# Patient Record
Sex: Male | Born: 1950 | Race: White | Hispanic: No | Smoking: Current every day smoker
Health system: Southern US, Community
[De-identification: ages and names within clinical notes are randomized; demographics above are authoritative.]

## PROBLEM LIST (undated history)

## (undated) DIAGNOSIS — H53002 Unspecified amblyopia, left eye: Secondary | ICD-10-CM

## (undated) DIAGNOSIS — I10 Essential (primary) hypertension: Secondary | ICD-10-CM

## (undated) DIAGNOSIS — J449 Chronic obstructive pulmonary disease, unspecified: Secondary | ICD-10-CM

## (undated) HISTORY — DX: Unspecified amblyopia, left eye: H53.002

## (undated) HISTORY — DX: Chronic obstructive pulmonary disease, unspecified: J44.9

## (undated) HISTORY — PX: EYE SURGERY: SHX253

## (undated) HISTORY — DX: Essential (primary) hypertension: I10

---

## 2014-01-07 ENCOUNTER — Ambulatory Visit (INDEPENDENT_AMBULATORY_CARE_PROVIDER_SITE_OTHER): Payer: Commercial Managed Care - HMO | Admitting: Physician Assistant

## 2014-01-07 ENCOUNTER — Encounter: Payer: Self-pay | Admitting: Physician Assistant

## 2014-01-07 VITALS — BP 138/72 | HR 73 | Ht 72.0 in | Wt 160.0 lb

## 2014-01-07 DIAGNOSIS — K2990 Gastroduodenitis, unspecified, without bleeding: Secondary | ICD-10-CM

## 2014-01-07 DIAGNOSIS — F3289 Other specified depressive episodes: Secondary | ICD-10-CM

## 2014-01-07 DIAGNOSIS — F32A Depression, unspecified: Secondary | ICD-10-CM | POA: Insufficient documentation

## 2014-01-07 DIAGNOSIS — F329 Major depressive disorder, single episode, unspecified: Secondary | ICD-10-CM | POA: Insufficient documentation

## 2014-01-07 DIAGNOSIS — K2970 Gastritis, unspecified, without bleeding: Secondary | ICD-10-CM

## 2014-01-07 DIAGNOSIS — F1010 Alcohol abuse, uncomplicated: Secondary | ICD-10-CM

## 2014-01-07 MED ORDER — PANTOPRAZOLE SODIUM 20 MG PO TBEC: 40.0000 mg | DELAYED_RELEASE_TABLET | Freq: Two times a day (BID) | ORAL | Status: DC

## 2014-01-07 MED ORDER — SUCRALFATE 1 G PO TABS: 1.0000 g | ORAL_TABLET | Freq: Four times a day (QID) | ORAL | Status: DC

## 2014-01-07 NOTE — Progress Notes (Signed)
Subjective:    Patient ID: Timothy Perkins, male    DOB: 1950/08/13, 63 y.o.   MRN: [id number]  HPI  Patient is a 63 year old male who presents to the clinic to establish care. Patient makes it very clear today that he only wants to get medication refills for his gastritis. He feels like his presentation to the emergency room on 11/16/2013 at [facility] was due to over consumption of liquor. He has since decreased and almost never drinks liquor. He continues to drink multiple beers every night. He was placed on Carafate and pantoprazole. CT was done and showed inflammation of stomach and duoduem. Labs were done and negative for any blood loss. Kidney and liver unremarkable.  He reports his abdominal pain has completely resolved. Patient denies any bright red blood or black tarry stools. He does admit to being very down and depressed since his wife of 73 years and has friends have all died with the same month. He denies any suicidal or homicidal thoughts.  .. Active Ambulatory Problems    Diagnosis Date Noted  . Alcohol abuse 01/07/2014  . Depression 01/07/2014   Resolved Ambulatory Problems    Diagnosis Date Noted  . No Resolved Ambulatory Problems   Past Medical History  Diagnosis Date  . Lazy eye of left side    .Marland Kitchen Family History  Problem Relation Age of Onset  . Cancer Father     Lung/smoker  . Kidney disease Sister    .Marland Kitchen History   Social History  . Marital Status: Widowed    Spouse Name: N/A    Number of Children: N/A  . Years of Education: N/A   Occupational History  . Not on file.   Social History Main Topics  . Smoking status: Current Every Day Smoker -- 1.00 packs/day for 50 years    Types: Cigarettes  . Smokeless tobacco: Never Used  . Alcohol Use: Yes  . Drug Use: No  . Sexual Activity: No   Other Topics Concern  . Not on file   Social History Narrative  . No narrative on file      Review of Systems  All other systems reviewed and are negative.       Objective:   Physical Exam  Constitutional: He is oriented to person, place, and time. He appears well-developed and well-nourished.  HENT:  Head: Normocephalic and atraumatic.  Cardiovascular: Normal rate, regular rhythm and normal heart sounds.   Pulmonary/Chest: Effort normal and breath sounds normal.  Abdominal: Soft. Bowel sounds are normal. He exhibits no distension and no mass. There is no tenderness. There is no guarding.  Neurological: He is alert and oriented to person, place, and time.  Skin: Skin is dry.  Psychiatric: He has a normal mood and affect. His behavior is normal.          Assessment & Plan:  Gastritis/duodenitis- refilled Carafate and protonix today. Discussed with patient that he should be able to start decreasing  the Carafate and just stay on protonix. Encouraged decrease in alcohol could help further stomach issues. Patient is aware that smoking is well is exacerbating his stomach symptoms. He is not willing to stop smoking. I encouraged patient to let me make a referral to [facility] for endoscopy. Pt declined. He did have CT scan at hospital which was negative for any masses.   Depression-is certainly fully patient is depressed and would benefit from medication. Patient declines medication today. He clearly is using alcohol to self medicate from  his sadness. He does have Ativan given to him from the emergency room that he is using as needed for sleep. Patient does admit that he is sad and is trying to find some new hobbies and new friends.  I discussed with patient importance of checking cholesterol, liver, kidney and getting up to date on immunizations. Patient declines tetanus shot, shingles vaccine, pneumonia vaccine as well as any labs. He states that he will not get them done.

## 2014-01-08 DIAGNOSIS — K2990 Gastroduodenitis, unspecified, without bleeding: Secondary | ICD-10-CM | POA: Insufficient documentation

## 2014-09-18 ENCOUNTER — Encounter: Payer: Self-pay | Admitting: Physician Assistant

## 2014-09-18 ENCOUNTER — Ambulatory Visit (INDEPENDENT_AMBULATORY_CARE_PROVIDER_SITE_OTHER): Payer: Commercial Managed Care - HMO | Admitting: Physician Assistant

## 2014-09-18 VITALS — BP 132/68 | HR 87 | Wt 165.0 lb

## 2014-09-18 DIAGNOSIS — F419 Anxiety disorder, unspecified: Secondary | ICD-10-CM | POA: Insufficient documentation

## 2014-09-18 DIAGNOSIS — L259 Unspecified contact dermatitis, unspecified cause: Secondary | ICD-10-CM | POA: Diagnosis not present

## 2014-09-18 DIAGNOSIS — F411 Generalized anxiety disorder: Secondary | ICD-10-CM | POA: Insufficient documentation

## 2014-09-18 MED ORDER — METHYLPREDNISOLONE SODIUM SUCC 125 MG IJ SOLR
125.0000 mg | Freq: Once | INTRAMUSCULAR | Status: AC
  Administered 2014-09-18: 125 mg via INTRAVENOUS

## 2014-09-18 MED ORDER — TRIAMCINOLONE ACETONIDE 0.1 % EX CREA: 1.0000 "application " | TOPICAL_CREAM | Freq: Two times a day (BID) | CUTANEOUS | Status: DC

## 2014-09-18 MED ORDER — LORAZEPAM 1 MG PO TABS: 1.0000 mg | ORAL_TABLET | Freq: Three times a day (TID) | ORAL | Status: DC

## 2014-09-18 NOTE — Patient Instructions (Signed)

## 2014-09-20 ENCOUNTER — Encounter: Payer: Self-pay | Admitting: Physician Assistant

## 2014-09-20 NOTE — Progress Notes (Signed)
   Subjective:    Patient ID: Timothy Perkins, male    DOB: 1950-06-21, 64 y.o.   MRN: [id number]  HPI Pt presents to the clinic with an itchy rash on bilateral lower arms and right lower abdomen. Tried chlorox and calamine lotion. Itchy seems to improve some with treatment but rash is not going away. No fever, chills, cough, SOB. Rash is not painful. No new medications or detergents or lotions used recently. He is always out and bout in the woods and outside.   He also wants a refill on ativan. He uses every so often to sleep and calm down. Approximately uses 2-3 times a month. Admits to drinking alcohol but not with ativan.    Review of Systems  All other systems reviewed and are negative.      Objective:   Physical Exam  Constitutional: He appears well-developed and well-nourished.  HENT:  Head: Normocephalic and atraumatic.  Cardiovascular: Normal rate, regular rhythm and normal heart sounds.   Pulmonary/Chest: Effort normal and breath sounds normal. He has no wheezes.  Skin:     Psychiatric: He has a normal mood and affect. His behavior is normal.          Assessment & Plan:  Contact dermatitis- solumedrol 125mg  IM given today. Triamcinolone given to use over rash. HO given. Follow up if not improving. Reassured not shingles. Did encourage the shingles vaccine and pneumonia vaccine today. Pt declined and stated he would consider for fall.   Anxiety- refilled ativan for PRN usage. Encouraged pt to NOT drink with ativan.

## 2015-01-18 ENCOUNTER — Ambulatory Visit: Payer: Commercial Managed Care - HMO | Admitting: Family Medicine

## 2015-03-17 ENCOUNTER — Ambulatory Visit (INDEPENDENT_AMBULATORY_CARE_PROVIDER_SITE_OTHER): Payer: Commercial Managed Care - HMO | Admitting: Physician Assistant

## 2015-03-17 VITALS — BP 156/72 | HR 92 | Ht 72.0 in | Wt 159.0 lb

## 2015-03-17 DIAGNOSIS — K30 Functional dyspepsia: Secondary | ICD-10-CM

## 2015-03-17 DIAGNOSIS — R1031 Right lower quadrant pain: Secondary | ICD-10-CM

## 2015-03-17 DIAGNOSIS — R109 Unspecified abdominal pain: Secondary | ICD-10-CM

## 2015-03-17 DIAGNOSIS — F1010 Alcohol abuse, uncomplicated: Secondary | ICD-10-CM | POA: Diagnosis not present

## 2015-03-17 DIAGNOSIS — K2990 Gastroduodenitis, unspecified, without bleeding: Secondary | ICD-10-CM

## 2015-03-17 MED ORDER — GI COCKTAIL ~~LOC~~
30.0000 mL | Freq: Once | ORAL | Status: AC
  Administered 2015-03-17: 30 mL via ORAL

## 2015-03-17 MED ORDER — LORAZEPAM 1 MG PO TABS: 1.0000 mg | ORAL_TABLET | Freq: Three times a day (TID) | ORAL | Status: DC

## 2015-03-17 MED ORDER — OMEPRAZOLE 40 MG PO CPDR: 40.0000 mg | DELAYED_RELEASE_CAPSULE | Freq: Every day | ORAL | Status: DC

## 2015-03-17 NOTE — Progress Notes (Signed)
   Subjective:    Patient ID: Timothy Perkins, male    DOB: 08/21/1950, 64 y.o.   MRN: [id number]  HPI Patient is a 64 year old male who presents to the clinic with right lower quadrant pain that radiates into back. He reports this pain has been ongoing for the last 1-2 years. It usually comes and goes but it has recently been present more than not. He denies any nausea but the pain was so bad the other night he did vomit. He has not been taking any pain medicine. He does drink 4-5 beers every day. He denies any pain with urination. He is having daily to every other day stools without blood. He has not tried anything to make better. He states that this pain is similar to the pain he went to the emergency room in 2015 with he stated they gave him some omeprazole that helped with pain. CT scan was done and showed inflammation of the stomach. He was diagnosed with gastritis at that time. He has not been on omeprazole but taking Zantac over-the-counter at this time.   Review of Systems  All other systems reviewed and are negative.      Objective:   Physical Exam  Constitutional: He is oriented to person, place, and time. He appears well-developed and well-nourished.  HENT:  Head: Normocephalic and atraumatic.  Cardiovascular: Normal rate, regular rhythm and normal heart sounds.   Pulmonary/Chest: Effort normal and breath sounds normal.  No CVA tenderness.   Abdominal: Soft. Bowel sounds are normal. He exhibits no distension and no mass. There is tenderness. There is guarding. There is no rebound.  RLQ guarding and tenderness.   Neurological: He is alert and oriented to person, place, and time.  Skin: Skin is dry.  Psychiatric: He has a normal mood and affect. His behavior is normal.          Assessment & Plan:  Right lower quadrant pain- unclear etiology at this time. Patient reports the pain is very similar to when he was diagnosed with gastritis. I did give patient a GI cocktail in the  office today. He was also refilled his omeprazole. He has not been on omeprazole in a few months. Since there was some guarding on exam today I did want to do a CT scan. Since pain has been going on for over a year I'm not as inclined to think this is urgent. Certainly cannot rule out diverticulitis/colitis Patient politely declined CT. Will check CBC and CMP today. Discussed need for colonoscopy. Pt was very adamant he did not want a colonoscopy. Follow-up if pain is worsening or not improving.  Anxiety-I did refill his Ativan today in office for as needed use.  Alcohol abuse- will check liver enzymes today.

## 2015-12-26 ENCOUNTER — Other Ambulatory Visit: Payer: Self-pay | Admitting: Physician Assistant

## 2016-02-20 DIAGNOSIS — Z7982 Long term (current) use of aspirin: Secondary | ICD-10-CM | POA: Diagnosis not present

## 2016-02-20 DIAGNOSIS — R05 Cough: Secondary | ICD-10-CM | POA: Diagnosis not present

## 2016-02-20 DIAGNOSIS — R0602 Shortness of breath: Secondary | ICD-10-CM | POA: Diagnosis not present

## 2016-02-20 DIAGNOSIS — F17210 Nicotine dependence, cigarettes, uncomplicated: Secondary | ICD-10-CM | POA: Diagnosis not present

## 2016-02-20 DIAGNOSIS — Z79899 Other long term (current) drug therapy: Secondary | ICD-10-CM | POA: Diagnosis not present

## 2016-02-20 DIAGNOSIS — J441 Chronic obstructive pulmonary disease with (acute) exacerbation: Secondary | ICD-10-CM | POA: Diagnosis not present

## 2016-02-20 DIAGNOSIS — R062 Wheezing: Secondary | ICD-10-CM | POA: Diagnosis not present

## 2016-02-25 ENCOUNTER — Encounter: Payer: Self-pay | Admitting: Physician Assistant

## 2016-02-25 ENCOUNTER — Ambulatory Visit (INDEPENDENT_AMBULATORY_CARE_PROVIDER_SITE_OTHER): Payer: Commercial Managed Care - HMO | Admitting: Physician Assistant

## 2016-02-25 VITALS — BP 162/77 | HR 83 | Ht 72.0 in | Wt 154.0 lb

## 2016-02-25 DIAGNOSIS — K2990 Gastroduodenitis, unspecified, without bleeding: Secondary | ICD-10-CM

## 2016-02-25 DIAGNOSIS — Z760 Encounter for issue of repeat prescription: Secondary | ICD-10-CM | POA: Diagnosis not present

## 2016-02-25 DIAGNOSIS — F411 Generalized anxiety disorder: Secondary | ICD-10-CM | POA: Diagnosis not present

## 2016-02-25 DIAGNOSIS — J441 Chronic obstructive pulmonary disease with (acute) exacerbation: Secondary | ICD-10-CM

## 2016-02-25 DIAGNOSIS — F1010 Alcohol abuse, uncomplicated: Secondary | ICD-10-CM | POA: Diagnosis not present

## 2016-02-25 DIAGNOSIS — F32A Depression, unspecified: Secondary | ICD-10-CM

## 2016-02-25 DIAGNOSIS — F17200 Nicotine dependence, unspecified, uncomplicated: Secondary | ICD-10-CM | POA: Diagnosis not present

## 2016-02-25 DIAGNOSIS — F329 Major depressive disorder, single episode, unspecified: Secondary | ICD-10-CM | POA: Diagnosis not present

## 2016-02-25 MED ORDER — ALBUTEROL SULFATE HFA 108 (90 BASE) MCG/ACT IN AERS: 2.0000 | INHALATION_SPRAY | Freq: Four times a day (QID) | RESPIRATORY_TRACT | 1 refills | Status: AC | PRN

## 2016-02-25 MED ORDER — OMEPRAZOLE 40 MG PO CPDR: 40.0000 mg | DELAYED_RELEASE_CAPSULE | Freq: Every day | ORAL | 4 refills | Status: DC

## 2016-02-25 MED ORDER — LORAZEPAM 1 MG PO TABS: 1.0000 mg | ORAL_TABLET | Freq: Three times a day (TID) | ORAL | 1 refills | Status: DC

## 2016-02-25 NOTE — Progress Notes (Addendum)
Subjective:     Patient ID: Timothy Perkins, male   DOB: October 01, 1950, 65 y.o.   MRN: [id number]  HPI The patient is a 65 y.o. Caucasian male presenting today for a post-hospital follow-up on 02/20/2016. Patient presented to ER with complaints of shortness of breath and difficulty breathing. Patient states that he had a cold last week and believes that this may have precipitated his symptoms. He notes that he is currently doing well and that his symptoms have completely resolved. The patient states that yesterday he did have a productive cough and was able to expel mucus. Additionally, the patient notes that his allergies are worse than normal this time of year but that he has been taking his over-the-counter anti-histamines. Patient reports that he is still taking the medications he was given at the ER and that he need medication refills for his chronic disease management. Patient is a smoker and currently smokes 2/3 of a pack per day. Patient reports that he has a 10 lbs weight loss but believes this is normal for him during the summer months. Patient denies chest pain, shortness of breath, palpitations, dyspnea on exertion, fever, night sweats, nausea, or vomiting.  Review of Systems  Constitutional: Positive for unexpected weight change. Negative for activity change, appetite change, chills, diaphoresis, fatigue and fever.  HENT: Negative for congestion, ear discharge, ear pain, postnasal drip, rhinorrhea, sinus pressure, sneezing and sore throat.   Respiratory: Negative for cough, chest tightness, shortness of breath and wheezing.   Cardiovascular: Negative for chest pain and palpitations.  Neurological: Negative for dizziness, weakness, light-headedness, numbness and headaches.      Objective:   Physical Exam  Constitutional: He is oriented to person, place, and time. He appears well-developed and well-nourished. No distress.  HENT:  Head: Normocephalic and atraumatic.  Eyes: Conjunctivae and  EOM are normal. Pupils are equal, round, and reactive to light. Right eye exhibits no discharge. Left eye exhibits no discharge. No scleral icterus.  Neck: Normal range of motion. Neck supple. No JVD present. No tracheal deviation present. No thyromegaly present.  Cardiovascular: Normal rate, regular rhythm, normal heart sounds and intact distal pulses.  Exam reveals no gallop and no friction rub.   No murmur heard. Pulmonary/Chest: Effort normal. No stridor. No respiratory distress. He has wheezes. He has rales. He exhibits no tenderness.  On auscultation bilateral coarse breath sounds, rhonchi, and rales are heard diffusely with worsening at the base. Positive wheezing auscultated bilaterally with worsening heard over the right lung.  Abdominal: Soft. Bowel sounds are normal. He exhibits no distension and no mass. There is no tenderness. There is no rebound and no guarding.  Lymphadenopathy:    He has no cervical adenopathy.  Neurological: He is alert and oriented to person, place, and time. He has normal reflexes. He displays normal reflexes. No cranial nerve deficit. He exhibits normal muscle tone. Coordination normal.  Skin: Skin is warm and dry. No rash noted. He is not diaphoretic. No erythema. No pallor.  Psychiatric: He has a normal mood and affect. His behavior is normal. Judgment and thought content normal.      Assessment:    Timothy Perkins was seen today for hospitalization follow-up.  Diagnoses and all orders for this visit:  COPD exacerbation ([place])  Medication refill  Alcohol abuse  Anxiety state  Depression  Gastritis and duodenitis  Tobacco dependency  Other orders -     albuterol (PROVENTIL HFA;VENTOLIN HFA) 108 (90 Base) MCG/ACT inhaler; Inhale 2 puffs into the  lungs every 6 (six) hours as needed for wheezing or shortness of breath. -     LORazepam (ATIVAN) 1 MG tablet; Take 1 tablet (1 mg total) by mouth every 8 (eight) hours. -     omeprazole (PRILOSEC) 40 MG  capsule; Take 1 capsule (40 mg total) by mouth daily.    Plan:     1. Post-hospital follow-up for COPD - Patient was hospitalized on 02/20/2016 for acute COPD exacerbation. Patient had ECG and chest x-ray with no abnormalities noted. Patient started on albuterol inhaler, prednisone, and keflex. Patient reports resolution of symptoms but was instructed to complete all medications as previously prescribed. Patient was given refills for albuterol inhaler today. Patient requested to not see a specialist immediately or take additional medications. Suspect possible non-compliance of patient. Patient agreed to return-to-clinic in 1 week for spirometry. Will determine need for medical intervention and referral at that time. Patient instructed to seek immediate medical care if symptoms reoccur.   2. Anxiety/depression- Patient states that he does not believe he needs these medications but requested a refill. Patient does not report any anxiety or depressive symptoms at this time. Will continue to monitor.  3. Gastritis and duodenitis - Patient given refills for omeprazole 40mg  capsules. Patient to continue current medication regimen at this as he reports he feels well managed. Will continue to monitor.  4. Tobacco/alcohol abuse - Discussed with patient the importance of tobacco and alcohol cessation. Patient is aware of the adverse effects of tobacco and alcohol use but states that he does not want to stop. Will consider further counseling upon return-to-clinic.

## 2016-02-25 NOTE — Patient Instructions (Signed)
Chronic Obstructive Pulmonary Disease Chronic obstructive pulmonary disease (COPD) is a common lung condition in which airflow from the lungs is limited. COPD is a general term that can be used to describe many different lung problems that limit airflow, including both chronic bronchitis and emphysema. If you have COPD, your lung function will probably never return to normal, but there are measures you can take to improve lung function and make yourself feel better. CAUSES   Smoking (common).  Exposure to secondhand smoke.  Genetic problems.  Chronic inflammatory lung diseases or recurrent infections. SYMPTOMS  Shortness of breath, especially with physical activity.  Deep, persistent (chronic) cough with a large amount of thick mucus.  Wheezing.  Rapid breaths (tachypnea).  Gray or bluish discoloration (cyanosis) of the skin, especially in your fingers, toes, or lips.  Fatigue.  Weight loss.  Frequent infections or episodes when breathing symptoms become much worse (exacerbations).  Chest tightness. DIAGNOSIS Your health care provider will take a medical history and perform a physical examination to diagnose COPD. Additional tests for COPD may include:  Lung (pulmonary) function tests.  Chest X-ray.  CT scan.  Blood tests. TREATMENT  Treatment for COPD may include:  Inhaler and nebulizer medicines. These help manage the symptoms of COPD and make your breathing more comfortable.  Supplemental oxygen. Supplemental oxygen is only helpful if you have a low oxygen level in your blood.  Exercise and physical activity. These are beneficial for nearly all people with COPD.  Lung surgery or transplant.  Nutrition therapy to gain weight, if you are underweight.  Pulmonary rehabilitation. This may involve working with a team of health care providers and specialists, such as respiratory, occupational, and physical therapists. HOME CARE INSTRUCTIONS  Take all medicines  (inhaled or pills) as directed by your health care provider.  Avoid over-the-counter medicines or cough syrups that dry up your airway (such as antihistamines) and slow down the elimination of secretions unless instructed otherwise by your health care provider.  If you are a smoker, the most important thing that you can do is stop smoking. Continuing to smoke will cause further lung damage and breathing trouble. Ask your health care provider for help with quitting smoking. He or she can direct you to community resources or hospitals that provide support.  Avoid exposure to irritants such as smoke, chemicals, and fumes that aggravate your breathing.  Use oxygen therapy and pulmonary rehabilitation if directed by your health care provider. If you require home oxygen therapy, ask your health care provider whether you should purchase a pulse oximeter to measure your oxygen level at home.  Avoid contact with individuals who have a contagious illness.  Avoid extreme temperature and humidity changes.  Eat healthy foods. Eating smaller, more frequent meals and resting before meals may help you maintain your strength.  Stay active, but balance activity with periods of rest. Exercise and physical activity will help you maintain your ability to do things you want to do.  Preventing infection and hospitalization is very important when you have COPD. Make sure to receive all the vaccines your health care provider recommends, especially the pneumococcal and influenza vaccines. Ask your health care provider whether you need a pneumonia vaccine.  Learn and use relaxation techniques to manage stress.  Learn and use controlled breathing techniques as directed by your health care provider. Controlled breathing techniques include:  Pursed lip breathing. Start by breathing in (inhaling) through your nose for 1 second. Then, purse your lips as if you were   going to whistle and breathe out (exhale) through the  pursed lips for 2 seconds.  Diaphragmatic breathing. Start by putting one hand on your abdomen just above your waist. Inhale slowly through your nose. The hand on your abdomen should move out. Then purse your lips and exhale slowly. You should be able to feel the hand on your abdomen moving in as you exhale.  Learn and use controlled coughing to clear mucus from your lungs. Controlled coughing is a series of short, progressive coughs. The steps of controlled coughing are: 1. Lean your head slightly forward. 2. Breathe in deeply using diaphragmatic breathing. 3. Try to hold your breath for 3 seconds. 4. Keep your mouth slightly open while coughing twice. 5. Spit any mucus out into a tissue. 6. Rest and repeat the steps once or twice as needed. SEEK MEDICAL CARE IF:  You are coughing up more mucus than usual.  There is a change in the color or thickness of your mucus.  Your breathing is more labored than usual.  Your breathing is faster than usual. SEEK IMMEDIATE MEDICAL CARE IF:  You have shortness of breath while you are resting.  You have shortness of breath that prevents you from:  Being able to talk.  Performing your usual physical activities.  You have chest pain lasting longer than 5 minutes.  Your skin color is more cyanotic than usual.  You measure low oxygen saturations for longer than 5 minutes with a pulse oximeter. MAKE SURE YOU:  Understand these instructions.  Will watch your condition.  Will get help right away if you are not doing well or get worse.   This information is not intended to replace advice given to you by your health care provider. Make sure you discuss any questions you have with your health care provider.   Document Released: 03/08/2005 Document Revised: 06/19/2014 Document Reviewed: 01/23/2013 Elsevier Interactive Patient Education 2016 [employer]  

## 2016-03-01 ENCOUNTER — Encounter: Payer: Self-pay | Admitting: Physician Assistant

## 2016-03-01 ENCOUNTER — Ambulatory Visit (INDEPENDENT_AMBULATORY_CARE_PROVIDER_SITE_OTHER): Payer: Commercial Managed Care - HMO | Admitting: Physician Assistant

## 2016-03-01 VITALS — BP 141/65 | HR 87 | Ht 72.0 in

## 2016-03-01 DIAGNOSIS — J441 Chronic obstructive pulmonary disease with (acute) exacerbation: Secondary | ICD-10-CM | POA: Diagnosis not present

## 2016-03-01 MED ORDER — FLUTICASONE FUROATE-VILANTEROL 100-25 MCG/INH IN AEPB: 1.0000 | INHALATION_SPRAY | Freq: Every day | RESPIRATORY_TRACT | 5 refills | Status: DC

## 2016-03-01 NOTE — Progress Notes (Addendum)
Subjective:     Patient ID: Timothy Perkins, male   DOB: 02/17/1951, 65 y.o.   MRN: [id number]  HPI Patient is a 65 y.o. Caucasian male presenting today for worsening of his acute COPD exacerbation. Patient presented to the ER on 02/20/2016 and was seen in office on 02/25/2016. The patient states that his COPD symptoms were improving but that he had a "bad day" yesterday. The patient reports that he was experiencing increased wheezing, shortness of breath, chest tightness, and productive cough. Patient notes that he has increased his inhaler use to 2 units every 6 hours with some symptomatic relief. Patient reports that he feels an improvement after increasing the frequency of his inhaler use. Patient denies palpitations, weight loss, severe chest pain, or fever. Patient reports that he is still smoking cigarettes but had decreased the amount per day.  Review of Systems  Constitutional: Positive for activity change. Negative for appetite change, chills, diaphoresis, fatigue, fever and unexpected weight change.  HENT: Negative.   Eyes: Negative.   Respiratory: Positive for cough, chest tightness and shortness of breath. Negative for apnea and wheezing.   Cardiovascular: Negative for chest pain, palpitations and leg swelling.  Gastrointestinal: Negative.   Endocrine: Negative.   Genitourinary: Negative.   Musculoskeletal: Negative.   Skin: Negative.   Neurological: Negative for dizziness, weakness, light-headedness, numbness and headaches.  Psychiatric/Behavioral: Negative.       Objective:   Physical Exam  Constitutional: He is oriented to person, place, and time. He appears well-developed and well-nourished. No distress.  HENT:  Head: Normocephalic and atraumatic.  Right Ear: External ear normal.  Left Ear: External ear normal.  Nose: Nose normal.  Mouth/Throat: Oropharynx is clear and moist. No oropharyngeal exudate.  Eyes: Conjunctivae and EOM are normal. Pupils are equal, round, and  reactive to light. Right eye exhibits no discharge. Left eye exhibits no discharge. No scleral icterus.  Neck: Normal range of motion. Neck supple. No JVD present. No tracheal deviation present. No thyromegaly present.  Cardiovascular: Normal rate, regular rhythm, normal heart sounds and intact distal pulses.  Exam reveals no gallop and no friction rub.   No murmur heard. Pulmonary/Chest: Effort normal. No stridor. No respiratory distress. He has wheezes. He has no rales. He exhibits no tenderness.  Patient with coarse breath sounds bilaterally on auscultation.   Abdominal: Soft. Bowel sounds are normal. He exhibits no distension and no mass. There is no tenderness. There is no rebound and no guarding.  Lymphadenopathy:    He has no cervical adenopathy.  Neurological: He is alert and oriented to person, place, and time. He displays normal reflexes. No cranial nerve deficit. He exhibits normal muscle tone. Coordination normal.  Skin: Skin is warm and dry. No rash noted. He is not diaphoretic. No erythema. No pallor.  Psychiatric: He has a normal mood and affect. His behavior is normal. Judgment and thought content normal.      Assessment:     Diagnoses and all orders for this visit:  COPD exacerbation ([place])  Other orders -     fluticasone furoate-vilanterol (BREO ELLIPTA) 100-25 MCG/INH AEPB; Inhale 1 puff into the lungs daily.      Plan:     1. COPD exacerbation - Patient with acute exacerbation of COPD currently treated with albuterol inhaler, prednisone, and keflex. Patient instructed to initiate treatment with daily inhaler Breo Ellipta. Patient to return-to-clinic in 1 week for spirometry or when his acute symptoms resolve. Will determine need for medical intervention and  specialist referral at that time. Patient instructed to seek immediate medical care if symptoms reoccur or he experiences chest pain, palpitation, or difficulty breathing.

## 2016-03-02 ENCOUNTER — Other Ambulatory Visit: Payer: Commercial Managed Care - HMO

## 2016-03-08 ENCOUNTER — Encounter: Payer: Self-pay | Admitting: Physician Assistant

## 2016-03-08 ENCOUNTER — Ambulatory Visit: Payer: Commercial Managed Care - HMO | Admitting: Physician Assistant

## 2016-03-08 ENCOUNTER — Ambulatory Visit (INDEPENDENT_AMBULATORY_CARE_PROVIDER_SITE_OTHER): Payer: Commercial Managed Care - HMO | Admitting: Physician Assistant

## 2016-03-08 VITALS — BP 150/73 | HR 74 | Ht 72.0 in | Wt 156.0 lb

## 2016-03-08 DIAGNOSIS — J449 Chronic obstructive pulmonary disease, unspecified: Secondary | ICD-10-CM | POA: Diagnosis not present

## 2016-03-08 DIAGNOSIS — R0602 Shortness of breath: Secondary | ICD-10-CM | POA: Diagnosis not present

## 2016-03-08 DIAGNOSIS — J42 Unspecified chronic bronchitis: Secondary | ICD-10-CM | POA: Insufficient documentation

## 2016-03-08 LAB — PULMONARY FUNCTION TEST

## 2016-03-08 MED ORDER — UMECLIDINIUM BROMIDE 62.5 MCG/INH IN AEPB: 1.0000 | INHALATION_SPRAY | Freq: Every day | RESPIRATORY_TRACT | 1 refills | Status: DC

## 2016-03-08 MED ORDER — FLUTICASONE FUROATE-VILANTEROL 100-25 MCG/INH IN AEPB: 1.0000 | INHALATION_SPRAY | Freq: Every day | RESPIRATORY_TRACT | 1 refills | Status: DC

## 2016-03-08 MED ORDER — UMECLIDINIUM-VILANTEROL 62.5-25 MCG/INH IN AEPB: 1.0000 | INHALATION_SPRAY | Freq: Every day | RESPIRATORY_TRACT | 1 refills | Status: DC

## 2016-03-08 MED ORDER — ALBUTEROL SULFATE (2.5 MG/3ML) 0.083% IN NEBU
2.5000 mg | INHALATION_SOLUTION | Freq: Once | RESPIRATORY_TRACT | Status: AC
  Administered 2016-03-08: 2.5 mg via RESPIRATORY_TRACT

## 2016-03-08 NOTE — Progress Notes (Signed)
Pt came into clinic today for spirometry test. Pt states he has had some shortness of breath for the last couple weeks and needs to be evaluated for possible COPD. Pt was able to preform spirometry with no immediate complications. Results given to PCP for review.

## 2016-03-08 NOTE — Patient Instructions (Addendum)
Incruse once a day one puff daily.  Breo once a day one puff daily.   Albuterol inhaler as needed for shortness of breath and wheezing.

## 2016-03-08 NOTE — Progress Notes (Signed)
   Subjective:    Patient ID: Timothy Perkins, male    DOB: [birth year], 65 y.o.   MRN: [id number]  HPI Pt comes in to discuss spirometry done today.  He continues to smoke 1/3 pack a day.     Review of Systems  All other systems reviewed and are negative.      Objective:   Physical Exam  Constitutional: He is oriented to person, place, and time. He appears well-developed and well-nourished.  Cardiovascular: Normal rate, regular rhythm and normal heart sounds.   Pulmonary/Chest: Effort normal.  Course breath sounds.   Neurological: He is alert and oriented to person, place, and time.  Psychiatric: He has a normal mood and affect. His behavior is normal.          Assessment & Plan:  COPD- FEV1 was 63, FVC was 78, FEV1/FVC was 59.8, FEF 25-75 12 percent change.shows predominately a obstructive process Stage II COPD. Certainly FVC is concerning from some restrictive process. Will repeat in 1 year.  On BREO added Incruse. Only use albuterol as needed for SOB.  Continue to work on smoking cessation.   HTN- discussed treatment today. He declines at this time. He states "I have never had blood pressure problems until now".  Will recheck in 3 months. If continues to be elevated discussed we NEED to start something. Encouraged DASH diet.

## 2016-06-16 ENCOUNTER — Ambulatory Visit (INDEPENDENT_AMBULATORY_CARE_PROVIDER_SITE_OTHER): Payer: Commercial Managed Care - HMO | Admitting: Physician Assistant

## 2016-06-16 ENCOUNTER — Encounter: Payer: Self-pay | Admitting: Physician Assistant

## 2016-06-16 VITALS — BP 164/88 | HR 82 | Ht 72.0 in | Wt 162.0 lb

## 2016-06-16 DIAGNOSIS — I10 Essential (primary) hypertension: Secondary | ICD-10-CM

## 2016-06-16 DIAGNOSIS — Z23 Encounter for immunization: Secondary | ICD-10-CM | POA: Diagnosis not present

## 2016-06-16 DIAGNOSIS — Z1211 Encounter for screening for malignant neoplasm of colon: Secondary | ICD-10-CM | POA: Diagnosis not present

## 2016-06-16 DIAGNOSIS — J449 Chronic obstructive pulmonary disease, unspecified: Secondary | ICD-10-CM | POA: Diagnosis not present

## 2016-06-16 DIAGNOSIS — J4489 Other specified chronic obstructive pulmonary disease: Secondary | ICD-10-CM

## 2016-06-16 MED ORDER — FLUTICASONE FUROATE-VILANTEROL 100-25 MCG/INH IN AEPB: 1.0000 | INHALATION_SPRAY | Freq: Every day | RESPIRATORY_TRACT | 1 refills | Status: DC

## 2016-06-16 MED ORDER — LISINOPRIL 10 MG PO TABS: 10.0000 mg | ORAL_TABLET | Freq: Every day | ORAL | 1 refills | Status: DC

## 2016-06-16 MED ORDER — UMECLIDINIUM BROMIDE 62.5 MCG/INH IN AEPB: 1.0000 | INHALATION_SPRAY | Freq: Every day | RESPIRATORY_TRACT | 1 refills | Status: DC

## 2016-06-16 MED ORDER — LISINOPRIL 10 MG PO TABS: 10.0000 mg | ORAL_TABLET | Freq: Every day | ORAL | 0 refills | Status: DC

## 2016-06-16 NOTE — Progress Notes (Signed)
   Subjective:    Patient ID: Timothy Perkins, male    DOB: [birth year], 66 y.o.   MRN: [id number]  HPI  Pt is a 66 yo male with COPD who presents to the clinic for a medication refill.   He has been on incruse and BREO for 3 months. He can breathe much better. He has had a signifcant improvement in SOB. He has started back to work. He is very physically active and feels better than ever. He has cut back smoking from 2 packs to 1 pack a day. He denies any CP, palpitations, headaches.     Review of Systems  All other systems reviewed and are negative.      Objective:   Physical Exam  Constitutional: He is oriented to person, place, and time. He appears well-developed and well-nourished.  HENT:  Head: Normocephalic and atraumatic.  Cardiovascular: Normal rate, regular rhythm and normal heart sounds.   Pulmonary/Chest: Effort normal and breath sounds normal.  Neurological: He is alert and oriented to person, place, and time.  Psychiatric: He has a normal mood and affect. His behavior is normal.          Assessment & Plan:  Marland KitchenMarland KitchenDiagnoses and all orders for this visit:  COPD (chronic obstructive pulmonary disease) with chronic bronchitis (HCC) -     fluticasone furoate-vilanterol (BREO ELLIPTA) 100-25 MCG/INH AEPB; Inhale 1 puff into the lungs daily. -     umeclidinium bromide (INCRUSE ELLIPTA) 62.5 MCG/INH AEPB; Inhale 1 puff into the lungs daily.  Influenza vaccine needed -     Flu Vaccine QUAD 36+ mos PF IM (Fluarix & Fluzone Quad PF)  Need for prophylactic vaccination against Streptococcus pneumoniae (pneumococcus) -     Pneumococcal conjugate vaccine 13-valent  Essential hypertension, benign -     lisinopril (PRINIVIL,ZESTRIL) 10 MG tablet; Take 1 tablet (10 mg total) by mouth daily.  Screening for colon cancer  Other orders -     Discontinue: lisinopril (PRINIVIL,ZESTRIL) 10 MG tablet; Take 1 tablet (10 mg total) by mouth daily.   cologuard information was completed  today.  Refilled inhalers for 6 months doing great.  Started lisinopril for HTN. Discussed side effects. Follow up in 4-6 weeks.

## 2016-06-30 ENCOUNTER — Encounter: Payer: Self-pay | Admitting: Physician Assistant

## 2016-07-03 ENCOUNTER — Other Ambulatory Visit: Payer: Self-pay

## 2016-07-03 DIAGNOSIS — J449 Chronic obstructive pulmonary disease, unspecified: Secondary | ICD-10-CM

## 2016-07-03 MED ORDER — UMECLIDINIUM BROMIDE 62.5 MCG/INH IN AEPB: 1.0000 | INHALATION_SPRAY | Freq: Every day | RESPIRATORY_TRACT | 5 refills | Status: DC

## 2016-07-03 MED ORDER — FLUTICASONE FUROATE-VILANTEROL 100-25 MCG/INH IN AEPB: 1.0000 | INHALATION_SPRAY | Freq: Every day | RESPIRATORY_TRACT | 5 refills | Status: DC

## 2016-07-27 ENCOUNTER — Other Ambulatory Visit: Payer: Self-pay | Admitting: Physician Assistant

## 2016-07-27 DIAGNOSIS — I10 Essential (primary) hypertension: Secondary | ICD-10-CM

## 2016-09-03 ENCOUNTER — Other Ambulatory Visit: Payer: Self-pay | Admitting: Physician Assistant

## 2016-09-03 DIAGNOSIS — I10 Essential (primary) hypertension: Secondary | ICD-10-CM

## 2016-09-29 ENCOUNTER — Ambulatory Visit (INDEPENDENT_AMBULATORY_CARE_PROVIDER_SITE_OTHER): Payer: Medicare HMO | Admitting: Physician Assistant

## 2016-09-29 ENCOUNTER — Encounter: Payer: Self-pay | Admitting: Physician Assistant

## 2016-09-29 VITALS — BP 148/86 | HR 83 | Ht 72.0 in | Wt 160.0 lb

## 2016-09-29 DIAGNOSIS — I10 Essential (primary) hypertension: Secondary | ICD-10-CM

## 2016-09-29 DIAGNOSIS — J449 Chronic obstructive pulmonary disease, unspecified: Secondary | ICD-10-CM | POA: Diagnosis not present

## 2016-09-29 MED ORDER — BECLOMETHASONE DIPROPIONATE 40 MCG/ACT IN AERS: 2.0000 | INHALATION_SPRAY | Freq: Two times a day (BID) | RESPIRATORY_TRACT | 0 refills | Status: DC

## 2016-09-29 MED ORDER — CETIRIZINE HCL 10 MG PO TABS: 10.0000 mg | ORAL_TABLET | Freq: Every day | ORAL | 4 refills | Status: DC

## 2016-09-29 NOTE — Progress Notes (Signed)
   Subjective:    Patient ID: Timothy Perkins, male    DOB: 03/02/1951, 66 y.o.   MRN: [id number]  HPI  Pt is a 66 yo male with COPD and HTN that presents to the clinic to follow up on blood pressure. He took one tablet for lisinopril and became dizzy and felt "drunk". He did not take any more. He has cut back on smoking and salt. He continues to drink alcohol daily and not willing to give that up. He denies any CP, palpitations, headaches.   COPD- very well controlled if takes inhalers but he has been taking as needed due to cost. He has a friend on qvar and wonders if he can take that because they will give him some inhalers.    Review of Systems  All other systems reviewed and are negative.      Objective:   Physical Exam  Constitutional: He is oriented to person, place, and time. He appears well-developed and well-nourished.  HENT:  Head: Normocephalic and atraumatic.  Cardiovascular: Normal rate and regular rhythm.   Pulmonary/Chest: Effort normal and breath sounds normal. He has no wheezes.  Neurological: He is alert and oriented to person, place, and time.  Psychiatric: He has a normal mood and affect. His behavior is normal.          Assessment & Plan:  Marland KitchenMarland KitchenAngelus was seen today for hypertension.  Diagnoses and all orders for this visit:  COPD (chronic obstructive pulmonary disease) with chronic bronchitis (HCC) -     beclomethasone (QVAR) 40 MCG/ACT inhaler; Inhale 2 puffs into the lungs 2 (two) times daily.  Essential hypertension, benign  Other orders -     cetirizine (ZYRTEC) 10 MG tablet; Take 1 tablet (10 mg total) by mouth daily.   Ok to stop BREO due to cost. Start qvar daily along with incruse daily. Follow up in 3 months.  BP ok on 2nd recheck. Continue working on stopping smoking and salt. Keep monitoring BP.

## 2016-12-29 ENCOUNTER — Ambulatory Visit: Payer: Medicare HMO | Admitting: Physician Assistant

## 2016-12-29 ENCOUNTER — Encounter: Payer: Self-pay | Admitting: Physician Assistant

## 2016-12-29 ENCOUNTER — Ambulatory Visit (INDEPENDENT_AMBULATORY_CARE_PROVIDER_SITE_OTHER): Payer: Medicare HMO | Admitting: Physician Assistant

## 2016-12-29 VITALS — BP 161/82 | HR 87 | Temp 97.9°F | Ht 72.0 in | Wt 164.0 lb

## 2016-12-29 DIAGNOSIS — J449 Chronic obstructive pulmonary disease, unspecified: Secondary | ICD-10-CM

## 2016-12-29 DIAGNOSIS — I10 Essential (primary) hypertension: Secondary | ICD-10-CM | POA: Diagnosis not present

## 2016-12-29 MED ORDER — LOSARTAN POTASSIUM 25 MG PO TABS: 25.0000 mg | ORAL_TABLET | Freq: Every day | ORAL | 0 refills | Status: DC

## 2016-12-29 MED ORDER — LOSARTAN POTASSIUM 25 MG PO TABS: 25.0000 mg | ORAL_TABLET | Freq: Every day | ORAL | 1 refills | Status: DC

## 2016-12-29 MED ORDER — FLUTICASONE-SALMETEROL 250-50 MCG/DOSE IN AEPB: 1.0000 | INHALATION_SPRAY | Freq: Two times a day (BID) | RESPIRATORY_TRACT | 11 refills | Status: DC

## 2016-12-29 NOTE — Progress Notes (Signed)
HPI:                                                                Timothy Perkins is a 66 y.o. male who presents to [place]: [place] today for 76-month follow-up  HTN: prescribed Lisinopril 10mg . Not compliant with medications due to side effect; states it made him feel "loopy." Does not check BP's at home. Denies vision change, headache, chest pain with exertion, orthopnea, lightheadedness, syncope and edema. Risk factors include:  COPD: continues to smoke 1 ppd. Switched to Qvar 3 months ago. Taking Ellipta Incruse. Most recent exacerbation 02/2016.  Reports he is using it at night. They have some leftover Advair and are wondering if they can switch.  Past Medical History:  Diagnosis Date  . COPD (chronic obstructive pulmonary disease) ([place])   . Hypertension   . Lazy eye of left side    Past Surgical History:  Procedure Laterality Date  . EYE SURGERY Left    Social History  Substance Use Topics  . Smoking status: Current Every Day Smoker    Packs/day: 1.00    Years: 50.00    Types: Cigarettes  . Smokeless tobacco: Never Used  . Alcohol use Yes   family history includes Cancer in his father; Kidney disease in his sister.  ROS: negative except as noted in the HPI  Medications: Current Outpatient Prescriptions  Medication Sig Dispense Refill  . albuterol (PROVENTIL HFA;VENTOLIN HFA) 108 (90 Base) MCG/ACT inhaler Inhale 2 puffs into the lungs every 6 (six) hours as needed for wheezing or shortness of breath. 3 Inhaler 1  . beclomethasone (QVAR) 40 MCG/ACT inhaler Inhale 2 puffs into the lungs 2 (two) times daily. 1 Inhaler 0  . cetirizine (ZYRTEC) 10 MG tablet Take 1 tablet (10 mg total) by mouth daily. 90 tablet 4  . ranitidine (ZANTAC) 75 MG tablet Take 75 mg by mouth daily.    Marland Kitchen umeclidinium bromide (INCRUSE ELLIPTA) 62.5 MCG/INH AEPB Inhale 1 puff into the lungs daily. 30 each 5   No current facility-administered medications for  this visit.    No Known Allergies     Objective:  BP (!) 161/82 (BP Location: Right Arm, Patient Position: Sitting, Cuff Size: Normal)   Pulse 87   Temp 97.9 F (36.6 C) (Oral)   Ht 6' (1.829 m)   Wt 164 lb (74.4 kg)   SpO2 97%   BMI 22.24 kg/m  Gen: well-groomed, cooperative, not ill-appearing, no distress HEENT: normal conjunctiva, wearing glasses, trachea midline Pulm: Normal work of breathing, normal phonation, breath sounds diminished, short expiratory phase, no wheezes, rales or rhonchi CV: Normal rate, regular rhythm, s1 and s2 distinct, no murmurs, clicks or rubs  Neuro: alert and oriented x 3, EOM's intact, no tremor MSK: extremities atraumatic, normal gait and station Skin: warm, dry, intact; no rashes on exposed skin, no cyanosis    No results found for this or any previous visit (from the past 72 hour(s)). No results found.    Assessment and Plan: 66 y.o. male with   1. COPD (chronic obstructive pulmonary disease) with chronic bronchitis ([place]) - okay to switch to ICS-LABA - Continue your Ellipta inhaler, 1 puff daily - Advair: 1 puff twice a day Continue  to cut back on smoking  - Fluticasone-Salmeterol (ADVAIR DISKUS) 250-50 MCG/DOSE AEPB; Inhale 1 puff into the lungs 2 (two) times daily.  Dispense: 1 each; Refill: 11  2. Essential hypertension, benign BP Readings from Last 3 Encounters:  12/29/16 (!) 161/82  09/29/16 (!) 148/86  06/16/16 (!) 164/88  - BP not at goal - will start ARB since patient is intolerant to ACE - losartan (COZAAR) 25 MG tablet; Take 1 tablet (25 mg total) by mouth daily.  Dispense: 90 tablet; Refill: 0   Patient education and anticipatory guidance given Patient agrees with treatment plan Follow-up in 2 weeks for nurse visit BP check or sooner as needed  Darlyne Russian PA-C

## 2016-12-29 NOTE — Patient Instructions (Addendum)
Continue your Ellipta inhaler, 1 puff daily Okay to switch from QVar to Advair Advair: 1 puff twice a day Continue to cut back on smoking   For your blood pressure: - Start Losartan 1 tablet every morning - Continue to limit salt. DASH eating plan - Return in 2 weeks for a nurse visit BP check   Managing Your Hypertension Hypertension is commonly called high blood pressure. This is when the force of your blood pressing against the walls of your arteries is too strong. Arteries are blood vessels that carry blood from your heart throughout your body. Hypertension forces the heart to work harder to pump blood, and may cause the arteries to become narrow or stiff. Having untreated or uncontrolled hypertension can cause heart attack, stroke, kidney disease, and other problems. What are blood pressure readings? A blood pressure reading consists of a higher number over a lower number. Ideally, your blood pressure should be below 120/80. The first ("top") number is called the systolic pressure. It is a measure of the pressure in your arteries as your heart beats. The second ("bottom") number is called the diastolic pressure. It is a measure of the pressure in your arteries as the heart relaxes. What does my blood pressure reading mean? Blood pressure is classified into four stages. Based on your blood pressure reading, your health care provider may use the following stages to determine what type of treatment you need, if any. Systolic pressure and diastolic pressure are measured in a unit called mm Hg. Normal  Systolic pressure: below 094.  Diastolic pressure: below 80. Elevated  Systolic pressure: 709-628.  Diastolic pressure: below 80. Hypertension stage 1  Systolic pressure: 366-294.  Diastolic pressure: 76-54. Hypertension stage 2  Systolic pressure: 650 or above.  Diastolic pressure: 90 or above. What health risks are associated with hypertension? Managing your hypertension is an  important responsibility. Uncontrolled hypertension can lead to:  A heart attack.  A stroke.  A weakened blood vessel (aneurysm).  Heart failure.  Kidney damage.  Eye damage.  Metabolic syndrome.  Memory and concentration problems.  What changes can I make to manage my hypertension? Hypertension can be managed by making lifestyle changes and possibly by taking medicines. Your health care provider will help you make a plan to bring your blood pressure within a normal range. Eating and drinking  Eat a diet that is high in fiber and potassium, and low in salt (sodium), added sugar, and fat. An example eating plan is called the DASH (Dietary Approaches to Stop Hypertension) diet. To eat this way: ? Eat plenty of fresh fruits and vegetables. Try to fill half of your plate at each meal with fruits and vegetables. ? Eat whole grains, such as whole wheat pasta, brown rice, or whole grain bread. Fill about one quarter of your plate with whole grains. ? Eat low-fat diary products. ? Avoid fatty cuts of meat, processed or cured meats, and poultry with skin. Fill about one quarter of your plate with lean proteins such as fish, chicken without skin, beans, eggs, and tofu. ? Avoid premade and processed foods. These tend to be higher in sodium, added sugar, and fat.  Reduce your daily sodium intake. Most people with hypertension should eat less than 1,500 mg of sodium a day.  Limit alcohol intake to no more than 1 drink a day for nonpregnant women and 2 drinks a day for men. One drink equals 12 oz of beer, 5 oz of wine, or 1 oz of  hard liquor. Lifestyle  Work with your health care provider to maintain a healthy body weight, or to lose weight. Ask what an ideal weight is for you.  Get at least 30 minutes of exercise that causes your heart to beat faster (aerobic exercise) most days of the week. Activities may include walking, swimming, or biking.  Include exercise to strengthen your muscles  (resistance exercise), such as weight lifting, as part of your weekly exercise routine. Try to do these types of exercises for 30 minutes at least 3 days a week.  Do not use any products that contain nicotine or tobacco, such as cigarettes and e-cigarettes. If you need help quitting, ask your health care provider.  Control any long-term (chronic) conditions you have, such as high cholesterol or diabetes. Monitoring  Monitor your blood pressure at home as told by your health care provider. Your personal target blood pressure may vary depending on your medical conditions, your age, and other factors.  Have your blood pressure checked regularly, as often as told by your health care provider. Working with your health care provider  Review all the medicines you take with your health care provider because there may be side effects or interactions.  Talk with your health care provider about your diet, exercise habits, and other lifestyle factors that may be contributing to hypertension.  Visit your health care provider regularly. Your health care provider can help you create and adjust your plan for managing hypertension. Will I need medicine to control my blood pressure? Your health care provider may prescribe medicine if lifestyle changes are not enough to get your blood pressure under control, and if:  Your systolic blood pressure is 130 or higher.  Your diastolic blood pressure is 80 or higher.  Take medicines only as told by your health care provider. Follow the directions carefully. Blood pressure medicines must be taken as prescribed. The medicine does not work as well when you skip doses. Skipping doses also puts you at risk for problems. Contact a health care provider if:  You think you are having a reaction to medicines you have taken.  You have repeated (recurrent) headaches.  You feel dizzy.  You have swelling in your ankles.  You have trouble with your vision. Get help right  away if:  You develop a severe headache or confusion.  You have unusual weakness or numbness, or you feel faint.  You have severe pain in your chest or abdomen.  You vomit repeatedly.  You have trouble breathing. Summary  Hypertension is when the force of blood pumping through your arteries is too strong. If this condition is not controlled, it may put you at risk for serious complications.  Your personal target blood pressure may vary depending on your medical conditions, your age, and other factors. For most people, a normal blood pressure is less than 120/80.  Hypertension is managed by lifestyle changes, medicines, or both. Lifestyle changes include weight loss, eating a healthy, low-sodium diet, exercising more, and limiting alcohol. This information is not intended to replace advice given to you by your health care provider. Make sure you discuss any questions you have with your health care provider. Document Released: 02/21/2012 Document Revised: 04/26/2016 Document Reviewed: 04/26/2016 Elsevier Interactive Patient Education  [employer].

## 2016-12-31 ENCOUNTER — Encounter: Payer: Self-pay | Admitting: Physician Assistant

## 2017-01-19 ENCOUNTER — Ambulatory Visit: Payer: Medicare HMO

## 2017-01-23 ENCOUNTER — Ambulatory Visit: Payer: Medicare HMO | Admitting: Physician Assistant

## 2017-01-24 ENCOUNTER — Ambulatory Visit: Payer: Medicare HMO | Admitting: Physician Assistant

## 2017-01-24 DIAGNOSIS — Z0189 Encounter for other specified special examinations: Secondary | ICD-10-CM

## 2017-03-09 ENCOUNTER — Telehealth: Payer: Self-pay | Admitting: Physician Assistant

## 2017-03-09 MED ORDER — CETIRIZINE HCL 10 MG PO TABS: 10.0000 mg | ORAL_TABLET | Freq: Every day | ORAL | 4 refills | Status: DC

## 2017-03-09 NOTE — Telephone Encounter (Signed)
Pt called req to have Zyrtec sent to [facility] mail order it is no cost to him 90 days supply 10 mg. Thannks

## 2017-03-09 NOTE — Telephone Encounter (Signed)
Rx sent 

## 2017-07-27 ENCOUNTER — Encounter: Payer: Self-pay | Admitting: Physician Assistant

## 2018-02-22 ENCOUNTER — Encounter: Payer: Self-pay | Admitting: Physician Assistant

## 2018-02-22 ENCOUNTER — Ambulatory Visit (INDEPENDENT_AMBULATORY_CARE_PROVIDER_SITE_OTHER): Payer: Medicare HMO | Admitting: Physician Assistant

## 2018-02-22 VITALS — BP 152/87 | HR 90 | Ht 72.0 in | Wt 169.0 lb

## 2018-02-22 DIAGNOSIS — Z131 Encounter for screening for diabetes mellitus: Secondary | ICD-10-CM

## 2018-02-22 DIAGNOSIS — Z13220 Encounter for screening for lipoid disorders: Secondary | ICD-10-CM | POA: Diagnosis not present

## 2018-02-22 DIAGNOSIS — I10 Essential (primary) hypertension: Secondary | ICD-10-CM

## 2018-02-22 DIAGNOSIS — J302 Other seasonal allergic rhinitis: Secondary | ICD-10-CM

## 2018-02-22 DIAGNOSIS — K2100 Gastro-esophageal reflux disease with esophagitis, without bleeding: Secondary | ICD-10-CM

## 2018-02-22 DIAGNOSIS — S39011A Strain of muscle, fascia and tendon of abdomen, initial encounter: Secondary | ICD-10-CM

## 2018-02-22 DIAGNOSIS — J42 Unspecified chronic bronchitis: Secondary | ICD-10-CM

## 2018-02-22 MED ORDER — RANITIDINE HCL 75 MG PO TABS: 75.0000 mg | ORAL_TABLET | Freq: Two times a day (BID) | ORAL | 4 refills | Status: DC

## 2018-02-22 MED ORDER — FLUTICASONE-UMECLIDIN-VILANT 100-62.5-25 MCG/INH IN AEPB: 1.0000 | INHALATION_SPRAY | Freq: Every day | RESPIRATORY_TRACT | 1 refills | Status: DC

## 2018-02-22 MED ORDER — CETIRIZINE HCL 10 MG PO TABS: 10.0000 mg | ORAL_TABLET | Freq: Every day | ORAL | 4 refills | Status: DC
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
## 2018-04-15 ENCOUNTER — Ambulatory Visit (INDEPENDENT_AMBULATORY_CARE_PROVIDER_SITE_OTHER): Payer: Medicare HMO | Admitting: Physician Assistant

## 2018-04-15 VITALS — BP 145/78 | HR 85 | Temp 98.2°F

## 2018-04-15 DIAGNOSIS — Z23 Encounter for immunization: Secondary | ICD-10-CM

## 2018-04-15 NOTE — Progress Notes (Signed)
Pt came into clinic today for pneumonia (Pneumovax23) and HD Flu shot. Pt reports no fever, allergies, history of bad reactions. Tolerated Flu shot in left deltoid, and pneumonia in right deltoid well. No immediate complications.   Pt's BP was elevated today. He did take his breathing treatment and smoke prior to appointment. Per PCP, no changes.   Agree with above plan. Iran Planas PA-C

## 2018-04-17 ENCOUNTER — Ambulatory Visit (INDEPENDENT_AMBULATORY_CARE_PROVIDER_SITE_OTHER): Payer: Medicare HMO | Admitting: Physician Assistant

## 2018-04-17 ENCOUNTER — Encounter: Payer: Self-pay | Admitting: Physician Assistant

## 2018-04-17 VITALS — BP 155/88 | HR 86 | Ht 72.0 in | Wt 168.0 lb

## 2018-04-17 DIAGNOSIS — I10 Essential (primary) hypertension: Secondary | ICD-10-CM | POA: Diagnosis not present

## 2018-04-17 DIAGNOSIS — L570 Actinic keratosis: Secondary | ICD-10-CM

## 2018-04-17 DIAGNOSIS — J4489 Other specified chronic obstructive pulmonary disease: Secondary | ICD-10-CM

## 2018-04-17 DIAGNOSIS — D0339 Melanoma in situ of other parts of face: Secondary | ICD-10-CM | POA: Diagnosis not present

## 2018-04-17 DIAGNOSIS — J449 Chronic obstructive pulmonary disease, unspecified: Secondary | ICD-10-CM

## 2018-04-17 DIAGNOSIS — L814 Other melanin hyperpigmentation: Secondary | ICD-10-CM | POA: Diagnosis not present

## 2018-04-17 NOTE — Progress Notes (Signed)
Subjective:    Patient ID: Timothy Perkins, male    DOB: [birth year], 67 y.o.   MRN: [id number]  HPI Pt is a 67 yo male with COPD and current smoker who presents to the clinic for follow up and to discuss some skin lesions.   He has a hyperpigmented lesion of his right temple for "years". It has gotten darker and maybe a little bigger and his daughter wants him to be checked out.   HTN- denies any CP, palpitations, headaches, vision changes. He does not check BP at home. He does not want to be on medications.   COPD- doing so much better on trelegy. Rarely using rescue inhaler.   .. Active Ambulatory Problems    Diagnosis Date Noted  . Alcohol abuse 01/07/2014  . Depression 01/07/2014  . Gastritis and duodenitis 01/08/2014  . Anxiety state 09/18/2014  . COPD exacerbation ([place]) 02/25/2016  . Tobacco dependency 02/25/2016  . COPD (chronic obstructive pulmonary disease) with chronic bronchitis ([place]) 03/08/2016  . Essential hypertension, benign 06/16/2016  . Gastroesophageal reflux disease with esophagitis 02/22/2018  . Seasonal allergies 02/22/2018  . Solar lentigo 04/17/2018  . Actinic keratosis 04/17/2018   Resolved Ambulatory Problems    Diagnosis Date Noted  . No Resolved Ambulatory Problems   Past Medical History:  Diagnosis Date  . COPD (chronic obstructive pulmonary disease) ([place])   . Hypertension   . Lazy eye of left side       Review of Systems See HPI.     Objective:   Physical Exam  Constitutional: He is oriented to person, place, and time. He appears well-developed and well-nourished.  HENT:  Head: Normocephalic and atraumatic.  Pulmonary/Chest: Effort normal and breath sounds normal. He has no wheezes.  Neurological: He is alert and oriented to person, place, and time.  Skin:  Right temple irregular shaped hyperpigmented macule with different variations of color.   Multiple tiny scaly lesions on forehead and checks.   Psychiatric: He has a normal  mood and affect. His behavior is normal.          Assessment & Plan:  Marland KitchenMarland KitchenDiagnoses and all orders for this visit:  Solar lentigo -     Dermatology pathology  Essential hypertension, benign  COPD (chronic obstructive pulmonary disease) with chronic bronchitis ([place])  Actinic keratosis   Shave Biopsy Procedure Note  Pre-operative Diagnosis: Suspicious lesion  Post-operative Diagnosis: same  Locations:right temple  Indications: changing color and size  Anesthesia: Lidocaine 1% with epinephrine without added sodium bicarbonate  Procedure Details  History of allergy to iodine: no  Patient informed of the risks (including bleeding and infection) and benefits of the  procedure and Verbal informed consent obtained.  The lesion and surrounding area were given a sterile prep using chlorhexidine and draped in the usual sterile fashion. A scalpel was used to shave an area of skin approximately 2cm by 3cm.  Hemostasis achieved with alumuninum chloride. Antibiotic ointment and a sterile dressing applied.  The specimen was sent for pathologic examination. The patient tolerated the procedure well.  EBL: scant  Condition: Stable  Complications: none.  Plan: 1. Instructed to keep the wound dry and covered for 24-48h and clean thereafter. 2. Warning signs of infection were reviewed.   3. Recommended that the patient use OTC acetaminophen as needed for pain.   Cryotherapy Procedure Note  Pre-operative Diagnosis: Actinic keratosis  Post-operative Diagnosis: Actinic keratosis  Locations: forehead and face  Indications: precancerous  Procedure Details  History of  allergy to iodine: no. Pacemaker? no.  Patient informed of risks (permanent scarring, infection, light or dark discoloration, bleeding, infection, weakness, numbness and recurrence of the lesion) and benefits of the procedure and verbal informed consent obtained.  The areas are treated with liquid nitrogen  therapy, frozen until ice ball extended 2 mm beyond lesion, allowed to thaw, and treated again. The patient tolerated procedure well.  The patient was instructed on post-op care, warned that there may be blister formation, redness and pain. Recommend OTC analgesia as needed for pain.  Condition: Stable  Complications: none.  Plan: 1. Instructed to keep the area dry and covered for 24-48h and clean thereafter. 2. Warning signs of infection were reviewed.   3. Recommended that the patient use OTC acetaminophen as needed for pain.    Discussed to get cholesterol checked. Pt declined stating " he would not do anything about it if high".   BP not to goal. Declines medication.   Continue trelegy.

## 2018-04-18 ENCOUNTER — Encounter: Payer: Self-pay | Admitting: Physician Assistant

## 2018-04-19 ENCOUNTER — Encounter: Payer: Self-pay | Admitting: Physician Assistant

## 2018-04-19 ENCOUNTER — Other Ambulatory Visit: Payer: Self-pay

## 2018-04-19 DIAGNOSIS — L814 Other melanin hyperpigmentation: Secondary | ICD-10-CM

## 2018-04-19 DIAGNOSIS — C439 Malignant melanoma of skin, unspecified: Secondary | ICD-10-CM | POA: Insufficient documentation

## 2018-04-19 NOTE — Progress Notes (Signed)
Call pt: unfortunately there were some cancer(melanoma) found in biopsy. Good news is it seems to be contained locally. I do need you to go to dermatologist for procedure to get more of surround tissue out and confirm no residual cells. Once pt aware. Ok to make dermatology referral(send pathology). Needs to be seen in next few weeks.

## 2018-05-13 ENCOUNTER — Encounter: Payer: Self-pay | Admitting: Physician Assistant

## 2018-06-03 ENCOUNTER — Telehealth: Payer: Self-pay

## 2018-06-03 MED ORDER — FAMOTIDINE 20 MG PO TABS: 20.0000 mg | ORAL_TABLET | Freq: Two times a day (BID) | ORAL | 5 refills | Status: DC

## 2018-06-03 NOTE — Telephone Encounter (Signed)
Patient called today and said that the pharmacy is currently out of his ranitidine, and was wondering if something else could be called out for him to take? Thanks!

## 2018-06-03 NOTE — Telephone Encounter (Signed)
Call pt: sent pepcid.

## 2018-06-06 NOTE — Telephone Encounter (Signed)
Called and left patient a voicemail notifying him of medication being sent to pharmacy. I also provided the office's contact information if any further questions or concerns.

## 2018-06-18 ENCOUNTER — Other Ambulatory Visit: Payer: Self-pay | Admitting: *Deleted

## 2018-06-18 MED ORDER — FAMOTIDINE 20 MG PO TABS: 20.0000 mg | ORAL_TABLET | Freq: Two times a day (BID) | ORAL | 1 refills | Status: DC

## 2018-06-18 NOTE — Progress Notes (Signed)
Rx sent to [facility] and should have been sent to [facility]. Resent to [facility] mail order. KG LPN

## 2018-06-28 ENCOUNTER — Encounter: Payer: Self-pay | Admitting: Physician Assistant

## 2018-06-28 ENCOUNTER — Ambulatory Visit (INDEPENDENT_AMBULATORY_CARE_PROVIDER_SITE_OTHER): Payer: Medicare HMO | Admitting: Physician Assistant

## 2018-06-28 VITALS — BP 145/68 | HR 82 | Temp 99.0°F | Ht 72.0 in | Wt 168.0 lb

## 2018-06-28 DIAGNOSIS — J441 Chronic obstructive pulmonary disease with (acute) exacerbation: Secondary | ICD-10-CM

## 2018-06-28 DIAGNOSIS — J440 Chronic obstructive pulmonary disease with acute lower respiratory infection: Secondary | ICD-10-CM | POA: Diagnosis not present

## 2018-06-28 DIAGNOSIS — I10 Essential (primary) hypertension: Secondary | ICD-10-CM | POA: Diagnosis not present

## 2018-06-28 DIAGNOSIS — F17200 Nicotine dependence, unspecified, uncomplicated: Secondary | ICD-10-CM

## 2018-06-28 DIAGNOSIS — J209 Acute bronchitis, unspecified: Secondary | ICD-10-CM

## 2018-06-28 MED ORDER — PREDNISONE 50 MG PO TABS: ORAL_TABLET | ORAL | 0 refills | Status: DC

## 2018-06-28 MED ORDER — AZITHROMYCIN 250 MG PO TABS: ORAL_TABLET | ORAL | 0 refills | Status: DC

## 2018-06-28 NOTE — Progress Notes (Signed)
Subjective:    Patient ID: Timothy Perkins, male    DOB: 1950-11-30, 68 y.o.   MRN: [id number]  HPI: This is a 67 year old male with a PMH of COPD who presents today with a chief complaint of productive cough and sinus pressure for two weeks. Patient states that he started with sinus pressure under his eyes and a productive cough of yellow sputum. He now feels that the sinus pressure has lessened but that the infection has now settled in his chest as he is still coughing up mucous. He has not tried any OTC medications for symptomatic relief. He does drink crown royal to suppress is cough.  He denies fever, fatigue, muscle aches, sore throat.  He is also a current pack/day smoker and desires to quit. He has tried patches before and was successful in quitting for one year until his wife started smoking again. He is compliant on trelegy daily.      Review of Systems  Constitutional: Negative for activity change, appetite change, fatigue and fever.  HENT: Positive for congestion, postnasal drip, sinus pressure and sinus pain. Negative for ear pain, sneezing and sore throat.   Eyes: Negative for discharge, redness and itching.  Respiratory: Positive for cough. Negative for chest tightness and shortness of breath.   Cardiovascular: Negative for chest pain and palpitations.  Gastrointestinal: Negative for diarrhea, nausea and vomiting.  Musculoskeletal: Negative for arthralgias, myalgias and neck pain.  Skin: Negative for pallor and rash.  Psychiatric/Behavioral: Negative for confusion, decreased concentration and sleep disturbance.       Objective:   Physical Exam Constitutional:      General: He is not in acute distress.    Appearance: He is not ill-appearing.  HENT:     Head: Normocephalic and atraumatic.     Right Ear: Tympanic membrane and ear canal normal. There is no impacted cerumen.     Left Ear: Tympanic membrane, ear canal and external ear normal. There is no impacted cerumen.     Nose: Nose normal.     Mouth/Throat:     Mouth: Mucous membranes are moist.     Pharynx: Oropharynx is clear. No oropharyngeal exudate or posterior oropharyngeal erythema.  Eyes:     Extraocular Movements: Extraocular movements intact.     Conjunctiva/sclera: Conjunctivae normal.     Pupils: Pupils are equal, round, and reactive to light.  Neck:     Musculoskeletal: Normal range of motion and neck supple.  Cardiovascular:     Rate and Rhythm: Normal rate.     Pulses: Normal pulses.     Heart sounds: Normal heart sounds.  Pulmonary:     Effort: Pulmonary effort is normal. No respiratory distress.     Breath sounds: Wheezing present. No rhonchi or rales.  Abdominal:     General: Abdomen is flat. Bowel sounds are normal. There is no distension.  Neurological:     Mental Status: He is alert.     Coordination: Coordination normal.     Gait: Gait normal.  Psychiatric:        Mood and Affect: Mood normal.        Behavior: Behavior normal.           Assessment & Plan:  Marland KitchenMarland KitchenNahome was seen today for cough.  Diagnoses and all orders for this visit:  COPD exacerbation ([place]) -     azithromycin (ZITHROMAX) 250 MG tablet; Take 2 tablets now and then one tablet for 4 days. -  predniSONE (DELTASONE) 50 MG tablet; One tab PO daily for 5 days.  Acute bronchitis with COPD ([place]) -     azithromycin (ZITHROMAX) 250 MG tablet; Take 2 tablets now and then one tablet for 4 days. -     predniSONE (DELTASONE) 50 MG tablet; One tab PO daily for 5 days.  Tobacco dependency  Essential hypertension, benign   -Can get OTC Mucinex to help loosen up sputum  Discussed alcohol intake. Pt drinks most nights and not willing to quit.   Smoking cessation -Start with 21mg  patches, decrease as cravings tolerate to 14mg  patches, then 7mg  patches. Can use nicorette gum to supplement when cravings persist. Follow up if having trouble.   Discussed elevated BP. Pt declined BP medication. Discussed  risk.   Marland KitchenVernetta Honey PA-C, have reviewed and agree with the above documentation in it's entirety.

## 2018-06-28 NOTE — Patient Instructions (Signed)

## 2018-09-03 ENCOUNTER — Other Ambulatory Visit: Payer: Self-pay | Admitting: Physician Assistant

## 2018-09-03 DIAGNOSIS — J42 Unspecified chronic bronchitis: Secondary | ICD-10-CM

## 2018-11-06 ENCOUNTER — Other Ambulatory Visit: Payer: Self-pay | Admitting: Physician Assistant

## 2018-11-06 ENCOUNTER — Encounter: Payer: Self-pay | Admitting: Physician Assistant

## 2018-11-06 DIAGNOSIS — J302 Other seasonal allergic rhinitis: Secondary | ICD-10-CM

## 2018-11-06 DIAGNOSIS — J42 Unspecified chronic bronchitis: Secondary | ICD-10-CM

## 2018-11-06 MED ORDER — CETIRIZINE HCL 10 MG PO TABS: 10.0000 mg | ORAL_TABLET | Freq: Every day | ORAL | 3 refills | Status: DC

## 2019-01-28 NOTE — Progress Notes (Signed)
Subjective:   Timothy Perkins is a 68 y.o. male who presents for an Initial Medicare Annual Wellness Visit.  Review of Systems  No ROS.  Medicare Wellness Virtual Visit.  Visual/audio telehealth visit, UTA vital signs.   See social history for additional risk factors.    Cardiac Risk Factors include: hypertension;advanced age (>16men, >38 women);smoking/ tobacco exposure;male gender Sleep patterns: getting 7-8 hours a night of sleep. Wakes up 2-3 times a night to go to the bathroom. Wakes up feeling refreshed  Home Safety/Smoke Alarms: Feels safe in home. Smoke alarms in place.  Living environment; Lives alone in a 2 story home. Stairs have hand rails on them. Shower is a tub combo and grab bars in place.  Seat Belt Safety/Bike Helmet: Wears seat belt.    Male:   CCS-  declined    PSA- No results found for: PSA      Objective:    Today's Vitals   02/05/19 0819  BP: (!) 153/71  Pulse: 69  Temp: 98 F (36.7 C)  TempSrc: Oral  SpO2: 100%  Weight: 166 lb (75.3 kg)  Height: 6' (1.829 m)   Body mass index is 22.51 kg/m.  Advanced Directives 02/05/2019  Does Patient Have a Medical Advance Directive? Yes  Type of Paramedic of Timothy Perkins;Living will  Does patient want to make changes to medical advance directive? No - Patient declined  Copy of Timothy Perkins in Chart? No - copy requested    Current Medications (verified) Outpatient Encounter Medications as of 02/05/2019  Medication Sig  . albuterol (PROVENTIL HFA;VENTOLIN HFA) 108 (90 Base) MCG/ACT inhaler Inhale 2 puffs into the lungs every 6 (six) hours as needed for wheezing or shortness of breath.  . cetirizine (ZYRTEC) 10 MG tablet Take 1 tablet (10 mg total) by mouth daily.  . famotidine (PEPCID) 20 MG tablet Take 1 tablet (20 mg total) by mouth 2 (two) times daily.  . TRELEGY ELLIPTA 100-62.5-25 MCG/INH AEPB INHALE 1 PUFF INTO THE LUNGS DAILY.   No facility-administered  encounter medications on file as of 02/05/2019.     Allergies (verified) Lisinopril   History: Past Medical History:  Diagnosis Date  . COPD (chronic obstructive pulmonary disease) ([place])   . Hypertension   . Lazy eye of left side    Past Surgical History:  Procedure Laterality Date  . EYE SURGERY Left    Family History  Problem Relation Age of Onset  . Cancer Father        Lung/smoker  . Kidney disease Sister    Social History   Socioeconomic History  . Marital status: Widowed    Spouse name: Not on file  . Number of children: 2  . Years of education: 86  . Highest education level: 11th grade  Occupational History  . 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 Financial resource strain: Not hard at all  . Food insecurity    Worry: Never true    Inability: Never true  . Transportation needs    Medical: No    Non-medical: No  Tobacco Use  . Smoking status: Current Every Day Smoker    Packs/day: 1.00    Years: 50.00    Pack years: 50.00    Types: Cigarettes  . Smokeless tobacco: Never Used  Substance and Sexual Activity  . Alcohol use: Yes    Alcohol/week: 5.0 standard drinks    Types: 5 Cans of beer per week  .  Drug use: No  . Sexual activity: Never  Lifestyle  . Physical activity    Days per week: 0 days    Minutes per session: 0 min  . Stress: Not at all  Relationships  . Social connections    Talks on phone: More than three times a week    Gets together: Twice a week    Attends religious service: Never    Active member of club or organization: No    Attends meetings of clubs or organizations: Never    Relationship status: Widowed  Other Topics Concern  . Not on file  Social History Narrative   Takes care of farm and animals.   Tobacco Counseling Ready to quit: No Counseling given: Not Answered   Clinical Intake:  Pre-visit preparation completed: Yes  Pain : No/denies pain     Nutritional Risks: None Diabetes:  No  How often do you need to have someone help you when you read instructions, pamphlets, or other written materials from your doctor or pharmacy?: 1 - Never What is the last grade level you completed in school?: 11  Interpreter Needed?: No  Information entered by :: Timothy Dakin, LPN  Activities of Daily Living In your present state of health, do you have any difficulty performing the following activities: 02/05/2019  Hearing? N  Vision? Y  Difficulty concentrating or making decisions? N  Walking or climbing stairs? N  Dressing or bathing? N  Doing errands, shopping? N  Preparing Food and eating ? N  Using the Toilet? N  In the past six months, have you accidently leaked urine? N  Do you have problems with loss of bowel control? N  Managing your Medications? N  Managing your Finances? N  Housekeeping or managing your Housekeeping? N  Some recent data might be hidden     Immunizations and Health Maintenance Immunization History  Administered Date(s) Administered  . Influenza, High Dose Seasonal PF 04/15/2018  . Influenza,inj,Quad PF,6+ Mos 06/16/2016  . Pneumococcal Conjugate-13 06/16/2016  . Pneumococcal Polysaccharide-23 04/15/2018   Health Maintenance Due  Topic Date Due  . INFLUENZA VACCINE  01/11/2019    Patient Care Team: Lavada Mesi as PCP - General (Family Medicine)  Indicate any recent Medical Services you may have received from other than [facility] providers in the past year (date may be approximate).    Assessment:   This is a routine wellness examination for Timothy Perkins.Physical assessment deferred to PCP.   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 Breakfast: sausage eggs and toast Lunch: go out to eat for lunch Dinner: Meat and vegetables and beer      Goals    . Patient Stated     Patient stated no goal  Depression Screen PHQ 2/9 Scores 02/05/2019 12/29/2016  PHQ - 2 Score 0 0    Fall Risk Fall Risk  02/05/2019  Falls in the past year? 0  Follow up Falls prevention discussed    Is the patient's home free of loose throw rugs in walkways, pet beds, electrical cords, etc?   yes      Grab bars in the bathroom? no      Handrails on the stairs?   yes      Adequate lighting?   yes   Cognitive Function:     6CIT Screen 02/05/2019  What Year? 0 points  What month? 0 points  What time? 0 points  Count back from 20 0 points  Months in reverse 4 points  Repeat phrase 2 points  Total Score 6    Screening Tests Health Maintenance  Topic Date Due  . INFLUENZA VACCINE  01/11/2019  . TETANUS/TDAP  02/23/2019 (Originally 09/24/1969)  . Hepatitis C Screening  02/23/2019 (Originally 1951/03/12)  . PNA vac Low Risk Adult  Completed  . Fecal DNA (Cologuard)  Discontinued         Plan:      Mr. Lippmann , Thank you for taking time to come for your Medicare Wellness Visit. I appreciate your ongoing commitment to your health goals. Please review the following plan we discussed and let me know if I can assist you in the future.  Please schedule your next medicare wellness visit with me in 1 yr.   These are the goals we discussed: Goals    . Patient Stated     Patient stated no goal       This is a list of the screening recommended for you and due dates:  Health Maintenance  Topic Date Due  . Flu Shot   01/11/2019  . Tetanus Vaccine  02/23/2019*  .  Hepatitis C: One time screening is recommended by Center for Disease Control  (CDC) for  adults born from [birth year] through 1967.   02/23/2019*  . Pneumonia vaccines  Completed  . Cologuard (Stool DNA test)  Discontinued  *Topic was postponed. The date shown is not the original due date.     I have personally reviewed and noted the following in the patient's chart:   . Medical and social history . Use of alcohol, tobacco or illicit drugs  . Current medications and supplements . Functional ability and status . Nutritional status . Physical activity . Advanced directives . List of other physicians . Hospitalizations, surgeries, and ER visits in previous 12 months . Vitals . Screenings to include cognitive, depression, and falls . Referrals and appointments  In addition, I have reviewed and discussed with patient certain preventive protocols, quality metrics, and best practice recommendations. A written personalized care plan for preventive services as well as general preventive health recommendations were provided to patient.     Joanne Chars, LPN   [id number]

## 2019-02-05 ENCOUNTER — Other Ambulatory Visit: Payer: Self-pay

## 2019-02-05 ENCOUNTER — Ambulatory Visit (INDEPENDENT_AMBULATORY_CARE_PROVIDER_SITE_OTHER): Payer: Medicare HMO | Admitting: *Deleted

## 2019-02-05 VITALS — BP 153/71 | HR 69 | Temp 98.0°F | Ht 72.0 in | Wt 166.0 lb

## 2019-02-05 DIAGNOSIS — Z0000 Encounter for general adult medical examination without abnormal findings: Secondary | ICD-10-CM | POA: Diagnosis not present

## 2019-02-05 NOTE — Patient Instructions (Addendum)
Mr. Desocio , Thank you for taking time to come for your Medicare Wellness Visit. I appreciate your ongoing commitment to your health goals. Please review the following plan we discussed and let me know if I can assist you in the future.  Please schedule your next medicare wellness visit with me in 1 yr.  These are the goals we discussed: Goals    . Patient Stated     Patient stated no goal

## 2019-06-10 ENCOUNTER — Other Ambulatory Visit: Payer: Self-pay | Admitting: Physician Assistant

## 2019-06-10 DIAGNOSIS — J42 Unspecified chronic bronchitis: Secondary | ICD-10-CM

## 2019-08-01 ENCOUNTER — Encounter: Payer: Self-pay | Admitting: Physician Assistant

## 2019-08-01 MED ORDER — FAMOTIDINE 20 MG PO TABS: 20.0000 mg | ORAL_TABLET | Freq: Two times a day (BID) | ORAL | 0 refills | Status: DC

## 2019-09-08 ENCOUNTER — Encounter: Payer: Self-pay | Admitting: Nurse Practitioner

## 2019-09-08 ENCOUNTER — Ambulatory Visit (INDEPENDENT_AMBULATORY_CARE_PROVIDER_SITE_OTHER): Payer: Medicare HMO | Admitting: Nurse Practitioner

## 2019-09-08 VITALS — BP 169/79 | HR 65 | Temp 97.5°F | Ht 72.0 in | Wt 167.1 lb

## 2019-09-08 DIAGNOSIS — B360 Pityriasis versicolor: Secondary | ICD-10-CM | POA: Diagnosis not present

## 2019-09-08 MED ORDER — KETOCONAZOLE 2 % EX SHAM: 1.0000 "application " | MEDICATED_SHAMPOO | Freq: Every day | CUTANEOUS | 0 refills | Status: DC

## 2019-09-08 NOTE — Patient Instructions (Signed)
Tinea Versicolor  Tinea versicolor is a skin infection. It is caused by a type of yeast. It is normal for some yeast to be on your skin, but too much yeast causes this infection. The infection causes a rash of light or dark patches on your skin. The rash is most common on the chest, back, neck, or upper arms. The infection usually does not cause other problems. If it is treated, it will probably go away in a few weeks. The infection cannot be spread from one person to another (is not contagious). Follow these instructions at home:  Use over-the-counter and prescription medicines only as told by your doctor.  Scrub your skin every day with dandruff shampoo as told by your doctor.  Do not scratch your skin in the rash area.  Avoid places that are hot and humid.  Do not use tanning booths.  Try to avoid sweating a lot. Contact a doctor if:  Your symptoms get worse.  You have a fever.  You have redness, swelling, or pain in the rash area.  You have fluid or blood coming from your rash.  Your rash feels warm to the touch.  You have pus or a bad smell coming from your rash.  Your rash comes back (recurs) after treatment. Summary  Tinea versicolor is a skin infection. It causes a rash of light or dark patches on your skin.  The rash is most common on the chest, back, neck, or upper arms. This infection usually does not cause other problems.  Use over-the-counter and prescription medicines only as told by your doctor.  If the infection is treated, it will probably go away in a few weeks. This information is not intended to replace advice given to you by your health care provider. Make sure you discuss any questions you have with your health care provider. Document Revised: 05/11/2017 Document Reviewed: 01/29/2017 Elsevier Patient Education  2020 [employer]  

## 2019-09-08 NOTE — Progress Notes (Signed)
Acute Office Visit  Subjective:    Patient ID: Timothy Perkins, male    DOB: 1950-12-26, 69 y.o.   MRN: [id number]  Chief Complaint  Patient presents with  . Rash    HPI Patient is in today for scattered macular, erythematous/tan, rash across his back and upper arms that has been present for about 2-3 weeks. He first noticed one patch on his right rib cage and one on his lower left shin that was itching. He tried gold bond powder, which helped with the itch, but the area did not seem to go away. He then reported he noticed multiple areas scattered across his back.  He has not started any new foods or medications, has not changed his soap, detergent, or lotion. He does reports some dry skin and itching in the winter occasionally, but this is different. He does not recall any insect or tick bites.   Past Medical History:  Diagnosis Date  . COPD (chronic obstructive pulmonary disease) ([place])   . Hypertension   . Lazy eye of left side     Past Surgical History:  Procedure Laterality Date  . EYE SURGERY Left     Family History  Problem Relation Age of Onset  . Cancer Father        Lung/smoker  . Kidney disease Sister     Social History   Socioeconomic History  . Marital status: Widowed    Spouse name: Not on file  . Number of children: 2  . Years of education: 108  . Highest education level: 11th grade  Occupational History  . Occupation: Games developer    Comment: retired  Tobacco Use  . Smoking status: Current Every Day Smoker    Packs/day: 1.00    Years: 50.00    Pack years: 50.00    Types: Cigarettes  . Smokeless tobacco: Never Used  Substance and Sexual Activity  . Alcohol use: Yes    Alcohol/week: 5.0 standard drinks    Types: 5 Cans of beer per week  . Drug use: No  . Sexual activity: Never  Other Topics Concern  . Not on file  Social History Narrative   Takes care of farm and animals.   Social Determinants of Health   Financial Resource Strain: Low Risk     . Difficulty of Paying Living Expenses: Not hard at all  Food Insecurity: No Food Insecurity  . Worried About Charity fundraiser in the Last Year: Never true  . Ran Out of Food in the Last Year: Never true  Transportation Needs: No Transportation Needs  . Lack of Transportation (Medical): No  . Lack of Transportation (Non-Medical): No  Physical Activity: Inactive  . Days of Exercise per Week: 0 days  . Minutes of Exercise per Session: 0 min  Stress: No Stress Concern Present  . Feeling of Stress : Not at all  Social Connections: Moderately Isolated  . Frequency of Communication with Friends and Family: More than three times a week  . Frequency of Social Gatherings with Friends and Family: Twice a week  . Attends Religious Services: Never  . Active Member of Clubs or Organizations: No  . Attends Archivist Meetings: Never  . Marital Status: Widowed  Intimate Partner Violence: Not At Risk  . Fear of Current or Ex-Partner: No  . Emotionally Abused: No  . Physically Abused: No  . Sexually Abused: No    Outpatient Medications Prior to Visit  Medication Sig Dispense Refill  .  albuterol (PROVENTIL HFA;VENTOLIN HFA) 108 (90 Base) MCG/ACT inhaler Inhale 2 puffs into the lungs every 6 (six) hours as needed for wheezing or shortness of breath. 3 Inhaler 1  . cetirizine (ZYRTEC) 10 MG tablet Take 1 tablet (10 mg total) by mouth daily. 90 tablet 3  . famotidine (PEPCID) 20 MG tablet Take 1 tablet (20 mg total) by mouth 2 (two) times daily. 180 tablet 0  . TRELEGY ELLIPTA 100-62.5-25 MCG/INH AEPB INHALE 1 PUFF INTO THE LUNGS DAILY. 180 each 0   No facility-administered medications prior to visit.    Allergies  Allergen Reactions  . Lisinopril     "loopy"    Review of Systems  Constitutional: Negative for appetite change, chills, fatigue and fever.  Respiratory: Negative for cough, chest tightness and shortness of breath.   Cardiovascular: Negative for chest pain,  palpitations and leg swelling.  Gastrointestinal: Negative for abdominal pain, constipation, diarrhea, nausea and vomiting.  Genitourinary: Negative for dysuria.  Musculoskeletal: Negative for arthralgias, joint swelling and myalgias.  Skin: Positive for rash. Negative for wound.  Allergic/Immunologic: Negative for environmental allergies, food allergies and immunocompromised state.  Neurological: Negative for weakness, light-headedness and headaches.  Hematological: Does not bruise/bleed easily.  Psychiatric/Behavioral: Negative for sleep disturbance.       Objective:    Physical Exam Vitals and nursing note reviewed.  Constitutional:      Appearance: Normal appearance.  HENT:     Head: Normocephalic.  Cardiovascular:     Rate and Rhythm: Normal rate and regular rhythm.     Pulses: Normal pulses.     Heart sounds: Normal heart sounds.  Pulmonary:     Effort: Pulmonary effort is normal.     Breath sounds: Normal breath sounds.  Abdominal:     General: Abdomen is flat. Bowel sounds are normal.     Palpations: Abdomen is soft.  Musculoskeletal:        General: Normal range of motion.     Cervical back: Normal range of motion.  Skin:    General: Skin is warm and dry.     Capillary Refill: Capillary refill takes less than 2 seconds.     Findings: Rash present.       Neurological:     General: No focal deficit present.     Mental Status: He is alert and oriented to person, place, and time.  Psychiatric:        Mood and Affect: Mood normal.        Behavior: Behavior normal.        Thought Content: Thought content normal.        Judgment: Judgment normal.     BP (!) 169/79   Pulse 65   Temp (!) 97.5 F (36.4 C) (Oral)   Ht 6' (1.829 m)   Wt 167 lb 1.3 oz (75.8 kg)   SpO2 100%   BMI 22.66 kg/m  Wt Readings from Last 3 Encounters:  09/08/19 167 lb 1.3 oz (75.8 kg)  02/05/19 166 lb (75.3 kg)  06/28/18 168 lb (76.2 kg)    Health Maintenance Due  Topic Date Due    . Hepatitis C Screening  Never done    There are no preventive care reminders to display for this patient.   No results found for: TSH No results found for: WBC, HGB, HCT, MCV, PLT No results found for: NA, K, CHLORIDE, CO2, GLUCOSE, BUN, CREATININE, BILITOT, ALKPHOS, AST, ALT, PROT, ALBUMIN, CALCIUM, ANIONGAP, EGFR, GFR No results found for:  CHOL No results found for: HDL No results found for: LDLCALC No results found for: TRIG No results found for: CHOLHDL No results found for: HGBA1C     Assessment & Plan:   1. Tinea versicolor Symptoms and presentation consistent with tinea versicolor.  Differential diagnosis may also include pityriasis rosea, however given the presence on the lower extremity this is less likely. We will plan to treat for fungal infection with ketoconazole shampoo.  Patient instructed to wash both hair and body daily and allow the medication to set 5 minutes before rinsing for at least the next 7 days.  Patient instructed that he may continue this process every 3 days after that time if symptoms are persistent, but improving. Patient instructed to follow-up if symptoms do not improve over the next 10 days or if they worsen. - ketoconazole (NIZORAL) 2 % shampoo; Apply 1 application topically daily. For hair and body. Allow to set 5 minutes before rinsing. Use for at least 7 days.  Dispense: 120 mL; Refill: 0  Return if symptoms worsen or fail to improve.   Orma Render, NP

## 2019-09-18 ENCOUNTER — Ambulatory Visit (INDEPENDENT_AMBULATORY_CARE_PROVIDER_SITE_OTHER): Payer: Medicare HMO | Admitting: Osteopathic Medicine

## 2019-09-18 ENCOUNTER — Other Ambulatory Visit: Payer: Self-pay

## 2019-09-18 ENCOUNTER — Encounter: Payer: Self-pay | Admitting: Osteopathic Medicine

## 2019-09-18 VITALS — BP 158/84 | HR 74 | Wt 166.0 lb

## 2019-09-18 DIAGNOSIS — I10 Essential (primary) hypertension: Secondary | ICD-10-CM | POA: Diagnosis not present

## 2019-09-18 DIAGNOSIS — R21 Rash and other nonspecific skin eruption: Secondary | ICD-10-CM | POA: Diagnosis not present

## 2019-09-18 MED ORDER — LISINOPRIL 2.5 MG PO TABS: 2.5000 mg | ORAL_TABLET | Freq: Every day | ORAL | 1 refills | Status: DC

## 2019-09-18 MED ORDER — TERBINAFINE HCL 250 MG PO TABS: 250.0000 mg | ORAL_TABLET | Freq: Every day | ORAL | 0 refills | Status: AC

## 2019-09-18 NOTE — Patient Instructions (Signed)
Will try low dose BP meds with Lisinopril  Will try oral medicine for rash I think the rash is probably a fungus, possibly something else? If no better after 1-2 weeks on the pills, please come see [place] for a skin biopsy Call sooner if worse/change!

## 2019-09-18 NOTE — Progress Notes (Signed)
Timothy Perkins is a 69 y.o. male who presents to  [facility]  today, 09/18/19, seeking care for the following: [place] with other pertinent history/findings:  The primary encounter diagnosis was Rash/skin eruption. A diagnosis of Essential hypertension was also pertinent to this visit.   Seems like ringworm/tinea, but could be pityriasis rosea, seems less likely to be mycosis fungoides but this would be in the differential. Low threshold for biopsy if no better w/ oral antifungals   Pt also would like to get back on BP meds, lisinopril caused him to feel woozy but he took 1/2 of the 10 mg tablets and felt ok, would like a super low dose and see how he does. Started Lisinopril 2.5 mg   Treated for tinea versicolor starting 03/29/20201 w/ ketoconazole shampoo - this has not helped.   Rash ongoing a few weeks. Itching. See photos.            Patient Instructions  Will try low dose BP meds with Lisinopril  Will try oral medicine for rash I think the rash is probably a fungus, possibly something else? If no better after 1-2 weeks on the pills, please come see [place] for a skin biopsy Call sooner if worse/change!       Orders Placed This Encounter  Procedures  . CBC  . COMPLETE METABOLIC PANEL WITH GFR    Meds ordered this encounter  Medications  . lisinopril (ZESTRIL) 2.5 MG tablet    Sig: Take 1 tablet (2.5 mg total) by mouth daily.    Dispense:  90 tablet    Refill:  1  . terbinafine (LAMISIL) 250 MG tablet    Sig: Take 1 tablet (250 mg total) by mouth daily.    Dispense:  14 tablet    Refill:  0       Follow-up instructions: Return if symptoms worsen or fail to improve.                                         BP (!) 158/84 (BP Location: Right Arm, Patient Position: Sitting, Cuff Size: Normal)   Pulse 74   Wt 166 lb (75.3 kg)   BMI 22.51  kg/m   Current Meds  Medication Sig  . albuterol (PROVENTIL HFA;VENTOLIN HFA) 108 (90 Base) MCG/ACT inhaler Inhale 2 puffs into the lungs every 6 (six) hours as needed for wheezing or shortness of breath.  . cetirizine (ZYRTEC) 10 MG tablet Take 1 tablet (10 mg total) by mouth daily.  . famotidine (PEPCID) 20 MG tablet Take 1 tablet (20 mg total) by mouth 2 (two) times daily.  Marland Kitchen ketoconazole (NIZORAL) 2 % shampoo Apply 1 application topically daily. For hair and body. Allow to set 5 minutes before rinsing. Use for at least 7 days.  . TRELEGY ELLIPTA 100-62.5-25 MCG/INH AEPB INHALE 1 PUFF INTO THE LUNGS DAILY.    No results found for this or any previous visit (from the past 72 hour(s)).  No results found.  Depression screen [facility] 2/9 02/05/2019 12/29/2016  Decreased Interest 0 0  Down, Depressed, Hopeless 0 0  PHQ - 2 Score 0 0    No flowsheet data found.    All questions at time of visit were answered - patient instructed to contact office with any additional concerns or updates.  ER/RTC precautions were reviewed with the patient.  Please note: voice recognition software was used to produce this document, and typos may escape review. Please contact Dr. Sheppard Coil for any needed clarifications.

## 2019-10-08 ENCOUNTER — Other Ambulatory Visit: Payer: Self-pay

## 2019-10-08 ENCOUNTER — Encounter: Payer: Self-pay | Admitting: Medical-Surgical

## 2019-10-08 ENCOUNTER — Ambulatory Visit (INDEPENDENT_AMBULATORY_CARE_PROVIDER_SITE_OTHER): Payer: Medicare HMO | Admitting: Medical-Surgical

## 2019-10-08 VITALS — BP 169/88 | HR 72 | Temp 98.0°F | Ht 72.0 in | Wt 171.3 lb

## 2019-10-08 DIAGNOSIS — R21 Rash and other nonspecific skin eruption: Secondary | ICD-10-CM | POA: Diagnosis not present

## 2019-10-08 DIAGNOSIS — L42 Pityriasis rosea: Secondary | ICD-10-CM | POA: Diagnosis not present

## 2019-10-08 DIAGNOSIS — Z1159 Encounter for screening for other viral diseases: Secondary | ICD-10-CM | POA: Diagnosis not present

## 2019-10-08 LAB — COMPLETE METABOLIC PANEL WITH GFR
AG Ratio: 1.9 (calc) (ref 1.0–2.5)
ALT: 26 U/L (ref 9–46)
AST: 21 U/L (ref 10–35)
Albumin: 4.4 g/dL (ref 3.6–5.1)
Alkaline phosphatase (APISO): 93 U/L (ref 35–144)
BUN: 9 mg/dL (ref 7–25)
CO2: 28 mmol/L (ref 20–32)
Calcium: 9.7 mg/dL (ref 8.6–10.3)
Chloride: 102 mmol/L (ref 98–110)
Creat: 0.77 mg/dL (ref 0.70–1.18)
GFR, Est African American: 106 mL/min/{1.73_m2} (ref >=60)
GFR, Est Non African American: 91 mL/min/{1.73_m2} (ref >=60)
Globulin: 2.3 g/dL (calc) (ref 1.9–3.7)
Glucose, Bld: 107 mg/dL — ABNORMAL HIGH (ref 65–99)
Potassium: 4.8 mmol/L (ref 3.5–5.3)
Sodium: 136 mmol/L (ref 135–146)
Total Bilirubin: 0.7 mg/dL (ref 0.2–1.2)
Total Protein: 6.7 g/dL (ref 6.1–8.1)

## 2019-10-08 LAB — CBC
HCT: 47.7 % (ref 38.5–50.0)
Hemoglobin: 16.7 g/dL (ref 13.2–17.1)
MCH: 32.9 pg (ref 27.0–33.0)
MCHC: 35 g/dL (ref 32.0–36.0)
MCV: 93.9 fL (ref 80.0–100.0)
MPV: 11 fL (ref 7.5–12.5)
Platelets: 175 10*3/uL (ref 140–400)
RBC: 5.08 10*6/uL (ref 4.20–5.80)
RDW: 12.3 % (ref 11.0–15.0)
WBC: 8.6 10*3/uL (ref 3.8–10.8)

## 2019-10-08 MED ORDER — TRIAMCINOLONE ACETONIDE 0.5 % EX CREA: 1.0000 "application " | TOPICAL_CREAM | Freq: Two times a day (BID) | CUTANEOUS | 3 refills | Status: DC

## 2019-10-08 NOTE — Progress Notes (Signed)
Subjective:    CC: no improvement in rash  HPI: Pleasant 69 year old male presenting today with reports of no improvement in the rash over the lower extremities, back, and right flank. Reports new spots have come up on his legs. Has tried ketoconazole shampoo and oral terbinafine with no resolution. Reports using rubbing alcohol over his entire body after a shower to help with itching. Has tried an antifungal spray with no relief. Last night, put Cort-aid on a few spots and noted a great reduction in itching. Itching worsens when he gets hot and sweaty.  Blood pressure elevated in office. Patient reports not taking lisinopril. Endorses eating high sodium foods and drinking beer nightly. Checking BP at home with readings 120-130s/70s after sitting for 10 minutes. Denies CP, SOB, lower extremity edema, HA, palpitations.  I reviewed the past medical history, family history, social history, surgical history, and allergies today and no changes were needed.  Please see the problem list section below in epic for further details.  Past Medical History: Past Medical History:  Diagnosis Date  . COPD (chronic obstructive pulmonary disease) ([place])   . Hypertension   . Lazy eye of left side    Past Surgical History: Past Surgical History:  Procedure Laterality Date  . EYE SURGERY Left    Social History: Social History   Socioeconomic History  . Marital status: Widowed    Spouse name: Not on file  . Number of children: 2  . Years of education: 60  . Highest education level: 11th grade  Occupational History  . Occupation: Games developer    Comment: retired  Tobacco Use  . Smoking status: Current Every Day Smoker    Packs/day: 1.00    Years: 50.00    Pack years: 50.00    Types: Cigarettes  . Smokeless tobacco: Never Used  Substance and Sexual Activity  . Alcohol use: Yes    Alcohol/week: 5.0 standard drinks    Types: 5 Cans of beer per week  . Drug use: No  . Sexual activity: Never  Other  Topics Concern  . Not on file  Social History Narrative   Takes care of farm and animals.   Social Determinants of Health   Financial Resource Strain: Low Risk   . Difficulty of Paying Living Expenses: Not hard at all  Food Insecurity: No Food Insecurity  . Worried About Charity fundraiser in the Last Year: Never true  . Ran Out of Food in the Last Year: Never true  Transportation Needs: No Transportation Needs  . Lack of Transportation (Medical): No  . Lack of Transportation (Non-Medical): No  Physical Activity: Inactive  . Days of Exercise per Week: 0 days  . Minutes of Exercise per Session: 0 min  Stress: No Stress Concern Present  . Feeling of Stress : Not at all  Social Connections: Moderately Isolated  . Frequency of Communication with Friends and Family: More than three times a week  . Frequency of Social Gatherings with Friends and Family: Twice a week  . Attends Religious Services: Never  . Active Member of Clubs or Organizations: No  . Attends Archivist Meetings: Never  . Marital Status: Widowed   Family History: Family History  Problem Relation Age of Onset  . Cancer Father        Lung/smoker  . Kidney disease Sister    Allergies: Allergies  Allergen Reactions  . Lisinopril     "loopy"   Medications: See med rec.  Review of  Systems: No fevers, chills, night sweats, weight loss, chest pain, or shortness of breath.   Objective:    General: Well Developed, well nourished, and in no acute distress.  Neuro: Alert and oriented x3.  HEENT: Normocephalic, atraumatic.  Skin: Warm and dry. Continued erythematous macules over lower extremities, mild improvement in lesions on back compared to clinical photos from 09/18/2019 appointment. Right flank lesion appears to be healing with improved erythema. No papules, pustules. Cardiac: Regular rate and rhythm, no murmurs rubs or gallops, no lower extremity edema.  Respiratory: Clear to auscultation bilaterally.  Not using accessory muscles, speaking in full sentences.   Impression and Recommendations:    1. Need for hepatitis C screening test Patient has labs to draw from last visit. Adding Hepatitis C screening today. - Hepatitis C antibody  2. Pityriasis rosea Initial presentation of rash started with large patch on the right flank and not responsive to topical or systemic antifungals. Presentation consistent with pityriasis rosea. Triamcinolone cream BID as needed to affected areas. Advised to try to get some sun exposure on affected areas as this may help with symptoms and facilitate resolution.  - triamcinolone cream (KENALOG) 0.5 %; Apply 1 application topically 2 (two) times daily. To affected areas.  Dispense: 30 g; Refill: 3  3. Hypertension With home BP and poor tolerance of normal dose of Lisinopril, will not change medications today. Advised to limit high sodium foods and eliminate adding salt. Continue to monitor BP at home and bring readings and cuff to next appointment.  Return in about 4 weeks (around 11/05/2019) for HTN follow; pt to bring home cuff.  ___________________________________________ Clearnce Sorrel, DNP, APRN, FNP-BC [facility] [place]

## 2019-10-09 LAB — HEPATITIS C ANTIBODY
Hepatitis C Ab: NONREACTIVE
SIGNAL TO CUT-OFF: 0.01 (ref <1.00)

## 2019-10-25 ENCOUNTER — Other Ambulatory Visit: Payer: Self-pay | Admitting: Physician Assistant

## 2019-10-25 DIAGNOSIS — J42 Unspecified chronic bronchitis: Secondary | ICD-10-CM

## 2019-11-05 ENCOUNTER — Ambulatory Visit: Payer: Medicare HMO | Admitting: Medical-Surgical

## 2019-12-16 ENCOUNTER — Other Ambulatory Visit: Payer: Self-pay | Admitting: Physician Assistant

## 2019-12-16 DIAGNOSIS — J302 Other seasonal allergic rhinitis: Secondary | ICD-10-CM

## 2020-02-11 ENCOUNTER — Ambulatory Visit: Payer: Medicare HMO

## 2020-02-15 ENCOUNTER — Other Ambulatory Visit: Payer: Self-pay | Admitting: Physician Assistant

## 2020-02-15 DIAGNOSIS — J42 Unspecified chronic bronchitis: Secondary | ICD-10-CM

## 2020-02-15 DIAGNOSIS — J302 Other seasonal allergic rhinitis: Secondary | ICD-10-CM

## 2020-04-15 ENCOUNTER — Encounter: Payer: Self-pay | Admitting: Physician Assistant

## 2020-04-16 ENCOUNTER — Other Ambulatory Visit: Payer: Self-pay | Admitting: *Deleted

## 2020-04-16 DIAGNOSIS — J302 Other seasonal allergic rhinitis: Secondary | ICD-10-CM

## 2020-04-16 DIAGNOSIS — J42 Unspecified chronic bronchitis: Secondary | ICD-10-CM

## 2020-04-16 MED ORDER — CETIRIZINE HCL 10 MG PO TABS: ORAL_TABLET | ORAL | 0 refills | Status: DC

## 2020-04-16 MED ORDER — TRELEGY ELLIPTA 100-62.5-25 MCG/INH IN AEPB: 1.0000 | INHALATION_SPRAY | Freq: Every day | RESPIRATORY_TRACT | 0 refills | Status: DC

## 2020-04-21 ENCOUNTER — Encounter: Payer: Self-pay | Admitting: Physician Assistant

## 2020-04-21 ENCOUNTER — Ambulatory Visit (INDEPENDENT_AMBULATORY_CARE_PROVIDER_SITE_OTHER): Payer: Medicare HMO | Admitting: Physician Assistant

## 2020-04-21 VITALS — BP 158/68 | HR 72 | Ht 72.0 in | Wt 169.0 lb

## 2020-04-21 DIAGNOSIS — Z23 Encounter for immunization: Secondary | ICD-10-CM

## 2020-04-21 DIAGNOSIS — I10 Essential (primary) hypertension: Secondary | ICD-10-CM | POA: Diagnosis not present

## 2020-04-21 DIAGNOSIS — Z13220 Encounter for screening for lipoid disorders: Secondary | ICD-10-CM | POA: Diagnosis not present

## 2020-04-21 DIAGNOSIS — F17200 Nicotine dependence, unspecified, uncomplicated: Secondary | ICD-10-CM

## 2020-04-21 DIAGNOSIS — K219 Gastro-esophageal reflux disease without esophagitis: Secondary | ICD-10-CM | POA: Diagnosis not present

## 2020-04-21 DIAGNOSIS — J302 Other seasonal allergic rhinitis: Secondary | ICD-10-CM | POA: Diagnosis not present

## 2020-04-21 DIAGNOSIS — J42 Unspecified chronic bronchitis: Secondary | ICD-10-CM | POA: Diagnosis not present

## 2020-04-21 MED ORDER — CETIRIZINE HCL 10 MG PO TABS: ORAL_TABLET | ORAL | 3 refills | Status: DC

## 2020-04-21 MED ORDER — FAMOTIDINE 20 MG PO TABS: 20.0000 mg | ORAL_TABLET | Freq: Two times a day (BID) | ORAL | 3 refills | Status: DC

## 2020-04-21 MED ORDER — TRELEGY ELLIPTA 100-62.5-25 MCG/INH IN AEPB: 1.0000 | INHALATION_SPRAY | Freq: Every day | RESPIRATORY_TRACT | 3 refills | Status: DC

## 2020-04-21 MED ORDER — LOSARTAN POTASSIUM 50 MG PO TABS: ORAL_TABLET | ORAL | 3 refills | Status: DC

## 2020-04-21 NOTE — Progress Notes (Signed)
Subjective:    Patient ID: Timothy Perkins, male    DOB: 1950-11-15, 69 y.o.   MRN: [id number]  HPI  Pt is a 69 yo male with COPD, HTN, seasonal allergies, GERD who presents to the clinic for medication refills.   Pt is doing well. He needs refills.   Breathing is great. Using albuterol inhaler about 5 times a month and his Trelegy daily. He remains active and working on cards. He continues to smoke. He has cut back a little.    Denies any CP, palpitations, headaches or vision changes. He does drink alcohol daily. Not checking BP at home.   GERD good on pepcid.   Seasonal allergies controlled.  .. Active Ambulatory Problems    Diagnosis Date Noted  . Alcohol abuse 01/07/2014  . Gastritis and duodenitis 01/08/2014  . Anxiety state 09/18/2014  . COPD exacerbation ([place]) 02/25/2016  . Tobacco dependency 02/25/2016  . COPD (chronic obstructive pulmonary disease) with chronic bronchitis ([place]) 03/08/2016  . Essential hypertension, benign 06/16/2016  . Gastroesophageal reflux disease with esophagitis 02/22/2018  . Seasonal allergies 02/22/2018  . Solar lentigo 04/17/2018  . Actinic keratosis 04/17/2018  . Melanoma of skin ([place]) 04/19/2018  . Current smoker 04/23/2020   Resolved Ambulatory Problems    Diagnosis Date Noted  . Depression 01/07/2014   Past Medical History:  Diagnosis Date  . COPD (chronic obstructive pulmonary disease) ([place])   . Hypertension   . Lazy eye of left side     Review of Systems  All other systems reviewed and are negative.      Objective:   Physical Exam Vitals reviewed.  Constitutional:      Appearance: Normal appearance.  HENT:     Head: Normocephalic.     Right Ear: Tympanic membrane normal.     Left Ear: Tympanic membrane normal.     Nose: Nose normal.     Mouth/Throat:     Mouth: Mucous membranes are moist.  Eyes:     Conjunctiva/sclera: Conjunctivae normal.  Neck:     Vascular: No carotid bruit.  Cardiovascular:     Rate and  Rhythm: Normal rate and regular rhythm.     Pulses: Normal pulses.     Heart sounds: Normal heart sounds.  Pulmonary:     Effort: Pulmonary effort is normal.     Breath sounds: Normal breath sounds.  Abdominal:     General: Bowel sounds are normal. There is no distension.     Palpations: Abdomen is soft.     Tenderness: There is no abdominal tenderness. There is no guarding.  Musculoskeletal:     Right lower leg: No edema.     Left lower leg: No edema.  Neurological:     General: No focal deficit present.     Mental Status: He is alert and oriented to person, place, and time.  Psychiatric:        Mood and Affect: Mood normal.        Behavior: Behavior normal.           Assessment & Plan:  Marland KitchenMarland KitchenDrake was seen today for follow-up.  Diagnoses and all orders for this visit:  Chronic bronchitis, unspecified chronic bronchitis type ([place]) -     Fluticasone-Umeclidin-Vilant (TRELEGY ELLIPTA) 100-62.5-25 MCG/INH AEPB; Inhale 1 puff into the lungs daily.  Flu vaccine need -     Flu Vaccine QUAD High Dose(Fluad)  Seasonal allergies -     cetirizine (ZYRTEC) 10 MG tablet; TAKE 1 TABLET  EVERY DAY  Screening for lipid disorders -     Lipid Panel w/reflex Direct LDL  Essential hypertension, benign -     losartan (COZAAR) 50 MG tablet; Start with one tablet daily.  Current smoker  Gastroesophageal reflux disease, unspecified whether esophagitis present -     famotidine (PEPCID) 20 MG tablet; Take 1 tablet (20 mg total) by mouth 2 (two) times daily.   Appears like patient is doing great.   COPD- well controlled. Refilled inhaler for 1 year.   GERD- well controlled. Refilled pepcid for 1 year.   BP elevated. Get cuff and start checking at home. Looking back has looked elevated in the past. SE to lisinopril. Start cozaar. Follow up in 3 months if start BP medication.   Not interested in smoking cessation. Not interested in CT lung cancer screening.  Pt declines colonoscopy or  cologuard.

## 2020-04-23 DIAGNOSIS — F17200 Nicotine dependence, unspecified, uncomplicated: Secondary | ICD-10-CM | POA: Insufficient documentation

## 2020-07-05 ENCOUNTER — Ambulatory Visit: Payer: Medicare HMO

## 2020-07-14 ENCOUNTER — Ambulatory Visit (INDEPENDENT_AMBULATORY_CARE_PROVIDER_SITE_OTHER): Payer: Medicare HMO | Admitting: Physician Assistant

## 2020-07-14 DIAGNOSIS — Z0000 Encounter for general adult medical examination without abnormal findings: Secondary | ICD-10-CM | POA: Diagnosis not present

## 2020-07-14 NOTE — Progress Notes (Signed)
Marland Kitchen    MEDICARE ANNUAL WELLNESS VISIT  07/14/2020  Telephone Visit Disclaimer This Medicare AWV was conducted by telephone due to national recommendations for restrictions regarding the COVID-19 Pandemic (e.g. social distancing).  I verified, using two identifiers, that I am speaking with Timothy Perkins or their authorized healthcare agent. I discussed the limitations, risks, security, and privacy concerns of performing an evaluation and management service by telephone and the potential availability of an in-person appointment in the future. The patient expressed understanding and agreed to proceed.  Location of Patient: Home Location of Provider (nurse):  In the office.  Subjective:    Timothy Perkins is a 70 y.o. male patient of Alden Hipp, Royetta Car, PA-C who had a Medicare Annual Wellness Visit today via telephone. Wayman is Retired and lives with an adult companion. he has 2 children. he reports that he is socially active and does interact with friends/family regularly. he is moderately physically active and enjoys working on cars and taking care of his farm animals.  Patient Care Team: Lavada Mesi as PCP - General (Family Medicine)  Advanced Directives 07/14/2020 02/05/2019  Does Patient Have a Medical Advance Directive? Yes Yes  Type of Advance Directive Living will [place];Living will  Does patient want to make changes to medical advance directive? No - Patient declined No - Patient declined  Copy of [place] in Chart? - No - copy requested    Hospital Utilization Over the Past 12 Months: # of hospitalizations or ER visits: 0 # of surgeries: 0  Review of Systems    Patient reports that his overall health is unchanged compared to last year.  History obtained from chart review and the patient  Patient Reported Readings (BP, Pulse, CBG, Weight, etc) none  Pain Assessment Pain : No/denies pain     Current Medications &  Allergies (verified) Allergies as of 07/14/2020      Reactions   Lisinopril    "loopy"      Medication List       Accurate as of July 14, 2020 10:27 AM. If you have any questions, ask your nurse or doctor.        albuterol 108 (90 Base) MCG/ACT inhaler Commonly known as: VENTOLIN HFA Inhale 2 puffs into the lungs every 6 (six) hours as needed for wheezing or shortness of breath.   cetirizine 10 MG tablet Commonly known as: ZYRTEC TAKE 1 TABLET EVERY DAY   famotidine 20 MG tablet Commonly known as: PEPCID Take 1 tablet (20 mg total) by mouth 2 (two) times daily. What changed: additional instructions   ketoconazole 2 % shampoo Commonly known as: NIZORAL Apply 1 application topically daily. For hair and body. Allow to set 5 minutes before rinsing. Use for at least 7 days.   losartan 50 MG tablet Commonly known as: COZAAR Start with one tablet daily.   Trelegy Ellipta 100-62.5-25 MCG/INH Aepb Generic drug: Fluticasone-Umeclidin-Vilant Inhale 1 puff into the lungs daily.       History (reviewed): Past Medical History:  Diagnosis Date  . COPD (chronic obstructive pulmonary disease) ([place])   . Hypertension   . Lazy eye of left side    Past Surgical History:  Procedure Laterality Date  . EYE SURGERY Left    Family History  Problem Relation Age of Onset  . Cancer Father        Lung/smoker  . Kidney disease Sister    Social History   Socioeconomic History  .  Marital status: Soil scientist    Spouse name: Not on file  . Number of children: 2  . Years of education: 15  . Highest education level: 11th grade  Occupational History  . Occupation: Games developer    Comment: retired  Tobacco Use  . Smoking status: Current Every Day Smoker    Packs/day: 1.00    Years: 50.00    Pack years: 50.00    Types: Cigarettes  . Smokeless tobacco: Never Used  Vaping Use  . Vaping Use: Never used  Substance and Sexual Activity  . Alcohol use: Yes    Alcohol/week: 5.0  standard drinks    Types: 5 Cans of beer per week  . Drug use: No  . Sexual activity: Yes  Other Topics Concern  . Not on file  Social History Narrative   Takes care of farm and animals. Likes to ride motorcycle.    Social Determinants of Health   Financial Resource Strain: Low Risk   . Difficulty of Paying Living Expenses: Not hard at all  Food Insecurity: No Food Insecurity  . Worried About Charity fundraiser in the Last Year: Never true  . Ran Out of Food in the Last Year: Never true  Transportation Needs: No Transportation Needs  . Lack of Transportation (Medical): No  . Lack of Transportation (Non-Medical): No  Physical Activity: Insufficiently Active  . Days of Exercise per Week: 7 days  . Minutes of Exercise per Session: 20 min  Stress: No Stress Concern Present  . Feeling of Stress : Not at all  Social Connections: Moderately Isolated  . Frequency of Communication with Friends and Family: More than three times a week  . Frequency of Social Gatherings with Friends and Family: Twice a week  . Attends Religious Services: Never  . Active Member of Clubs or Organizations: No  . Attends Archivist Meetings: Never  . Marital Status: Living with partner    Activities of Daily Living In your present state of health, do you have any difficulty performing the following activities: 07/14/2020  Hearing? N  Vision? N  Difficulty concentrating or making decisions? N  Walking or climbing stairs? N  Dressing or bathing? N  Doing errands, shopping? N  Preparing Food and eating ? N  Using the Toilet? N  In the past six months, have you accidently leaked urine? N  Do you have problems with loss of bowel control? N  Managing your Medications? N  Managing your Finances? N  Housekeeping or managing your Housekeeping? N  Some recent data might be hidden    Patient Education/ Literacy What is the last grade level you completed in school?: 11th grade.  Exercise Current  Exercise Habits: Home exercise routine, Type of exercise: walking, Time (Minutes): 40, Frequency (Times/Week): 7, Weekly Exercise (Minutes/Week): 280, Intensity: Moderate, Exercise limited by: None identified  Diet Patient reports consuming 2 meals a day and 1 snack(s) a day Patient reports that his primary diet is: Regular Patient reports that she does have regular access to food.   Depression Screen PHQ 2/9 Scores 07/14/2020 04/21/2020 02/05/2019 12/29/2016  PHQ - 2 Score 0 0 0 0  PHQ- 9 Score 0 - - -     Fall Risk Fall Risk  07/14/2020 04/21/2020 02/05/2019  Falls in the past year? 0 0 0  Number falls in past yr: 0 0 -  Injury with Fall? 0 0 -  Risk for fall due to : No Fall Risks - -  Follow up Falls evaluation completed Falls evaluation completed Falls prevention discussed     Objective:  KAS SHYNE seemed alert and oriented and he participated appropriately during our telephone visit.  Blood Pressure Weight BMI  BP Readings from Last 3 Encounters:  04/21/20 (!) 158/68  10/08/19 (!) 169/88  09/18/19 (!) 158/84   Wt Readings from Last 3 Encounters:  04/21/20 169 lb (76.7 kg)  10/08/19 171 lb 4.8 oz (77.7 kg)  09/18/19 166 lb (75.3 kg)   BMI Readings from Last 1 Encounters:  04/21/20 22.92 kg/m    *Unable to obtain current vital signs, weight, and BMI due to telephone visit type  Hearing/Vision  . Song did not seem to have difficulty with hearing/understanding during the telephone conversation . Reports that he has not had a formal eye exam by an eye care professional within the past year . Reports that he has not had a formal hearing evaluation within the past year *Unable to fully assess hearing and vision during telephone visit type  Cognitive Function: 6CIT Screen 07/14/2020 02/05/2019  What Year? 0 points 0 points  What month? 0 points 0 points  What time? 0 points 0 points  Count back from 20 0 points 0 points  Months in reverse 4 points 4 points  Repeat  phrase 0 points 2 points  Total Score 4 6   (Normal:0-7, Significant for Dysfunction: >8)  Normal Cognitive Function Screening: Yes   Immunization & Health Maintenance Record Immunization History  Administered Date(s) Administered  . Fluad Quad(high Dose 65+) 04/21/2020  . Influenza, High Dose Seasonal PF 04/15/2018, 04/30/2019  . Influenza,inj,Quad PF,6+ Mos 06/16/2016  . Moderna Sars-Covid-2 Vaccination 06/25/2020  . Pneumococcal Conjugate-13 06/16/2016  . Pneumococcal Polysaccharide-23 04/15/2018    Health Maintenance  Topic Date Due  . TETANUS/TDAP  04/21/2021 (Originally 09/24/1969)  . COVID-19 Vaccine (2 - Moderna risk 4-dose series) 07/23/2020  . INFLUENZA VACCINE  Completed  . Hepatitis C Screening  Completed  . PNA vac Low Risk Adult  Completed  . Fecal DNA (Cologuard)  Discontinued       Assessment  This is a routine wellness examination for RENNARD DERUITER.  Health Maintenance: Due or Overdue There are no preventive care reminders to display for this patient.  Timothy Perkins does not need a referral for Community Assistance: Care Management:   no Social Work:    no Prescription Assistance:  no Nutrition/Diabetes Education:  no   Plan:  Personalized Goals Goals Addressed              This Visit's Progress   .  Patient Stated (pt-stated)        07/14/2020 AWV Goal: Tobacco Cessation  Smoking cessation instruction/counseling given:  counseled patient on the dangers of tobacco use, advised patient to stop smoking, and reviewed strategies to maximize success   Patient will verbalize understanding of the health risks associated with smoking/tobacco use  Lung cancer or lung disease, such as COPD  Heart disease.  Stroke.  Heart attack  Infertility  Osteoporosis and bone fractures. . Patient will create a plan to quit smoking/using tobacco  Pick a date to quit.   Write down the reasons why you are quitting and put it where you will see it  often.  Identify the people, places, things, and activities that make you want to smoke (triggers) and avoid them. Make sure to take these actions: ? Throw away all cigarettes at home, at work, and in your car. ?  Throw away smoking accessories, such as Scientist, research (medical). ? Clean your car and make sure to empty the ashtray. ? Clean your home, including curtains and carpets.  Tell your family, friends, and coworkers that you are quitting. Support from your loved ones can make quitting easier.  Talk with your health care provider about your options for quitting smoking.  Find out what treatment options are covered by your health insurance. . Patient will be able to demonstrate knowledge of tobacco cessation strategies that may maximize success  Quitting "cold [place]" is more successful than gradually quitting.  Attending in-person counseling to help you build problem-solving skills.   Finding resources and support systems that can help you to quit smoking and remain smoke-free after you quit. These resources are most helpful when you use them often. They can include: ? Online chats with a Social worker. ? Telephone quitlines. ? Careers information officer. ? Support groups or group counseling. ? Text messaging programs. ? Mobile phone applications.  Taking medicines to help you quit smoking: ? Nicotine patches, gum, or lozenges. ? Nicotine inhalers or sprays. ? Non-nicotine medicine that is taken by mouth. . Patient will note get discouraged if the process is difficult . Over the next year, patient will stop smoking or using other forms of tobacco  Smoking cessation instruction/counseling given:  counseled patient on the dangers of tobacco use, advised patient to stop smoking, and reviewed strategies to maximize success        Personalized Health Maintenance & Screening Recommendations  Smoking cessation counseling  Lung Cancer Screening Recommended: yes, patient refused (Low  Dose CT Chest recommended if Age 75-80 years, 30 pack-year currently smoking OR have quit w/in past 15 years) Hepatitis C Screening recommended: not applicable HIV Screening recommended: yes  Advanced Directives: Written information was not prepared per patient's request.  Referrals & Orders No orders of the defined types were placed in this encounter.   Follow-up Plan . Follow-up with Donella Stade, PA-C as planned . Schedule your appointment at the pharmacy for your shingrix vaccine.  . Schedule your eye doctor appointment.  . Tetanus shot is due in Nov, 2022. (make sure to schedule that at your pharmacy).   I have personally reviewed and noted the following in the patient's chart:   . Medical and social history . Use of alcohol, tobacco or illicit drugs  . Current medications and supplements . Functional ability and status . Nutritional status . Physical activity . Advanced directives . List of other physicians . Hospitalizations, surgeries, and ER visits in previous 12 months . Vitals . Screenings to include cognitive, depression, and falls . Referrals and appointments  In addition, I have reviewed and discussed with Timothy Perkins certain preventive protocols, quality metrics, and best practice recommendations. A written personalized care plan for preventive services as well as general preventive health recommendations is available and can be mailed to the patient at his request.      Tinnie Gens, RN  07/14/2020

## 2020-07-14 NOTE — Patient Instructions (Addendum)
Health Maintenance, Male Adopting a healthy lifestyle and getting preventive care are important in promoting health and wellness. Ask your health care provider about:  The right schedule for you to have regular tests and exams.  Things you can do on your own to prevent diseases and keep yourself healthy. What should I know about diet, weight, and exercise? Eat a healthy diet  Eat a diet that includes plenty of vegetables, fruits, low-fat dairy products, and lean protein.  Do not eat a lot of foods that are high in solid fats, added sugars, or sodium.   Maintain a healthy weight Body mass index (BMI) is a measurement that can be used to identify possible weight problems. It estimates body fat based on height and weight. Your health care provider can help determine your BMI and help you achieve or maintain a healthy weight. Get regular exercise Get regular exercise. This is one of the most important things you can do for your health. Most adults should:  Exercise for at least 150 minutes each week. The exercise should increase your heart rate and make you sweat (moderate-intensity exercise).  Do strengthening exercises at least twice a week. This is in addition to the moderate-intensity exercise.  Spend less time sitting. Even light physical activity can be beneficial. Watch cholesterol and blood lipids Have your blood tested for lipids and cholesterol at 72 years of age, then have this test every 5 years. You may need to have your cholesterol levels checked more often if:  Your lipid or cholesterol levels are high.  You are older than 72 years of age.  You are at high risk for heart disease. What should I know about cancer screening? Many types of cancers can be detected early and may often be prevented. Depending on your health history and family history, you may need to have cancer screening at various ages. This may include screening for:  Colorectal cancer.  Prostate  cancer.  Skin cancer.  Lung cancer. What should I know about heart disease, diabetes, and high blood pressure? Blood pressure and heart disease  High blood pressure causes heart disease and increases the risk of stroke. This is more likely to develop in people who have high blood pressure readings, are of African descent, or are overweight.  Talk with your health care provider about your target blood pressure readings.  Have your blood pressure checked: ? Every 3-5 years if you are 55-22 years of age. ? Every year if you are 41 years old or older.  If you are between the ages of 16 and 56 and are a current or former smoker, ask your health care provider if you should have a one-time screening for abdominal aortic aneurysm (AAA). Diabetes Have regular diabetes screenings. This checks your fasting blood sugar level. Have the screening done:  Once every three years after age 70 if you are at a normal weight and have a low risk for diabetes.  More often and at a younger age if you are overweight or have a high risk for diabetes. What should I know about preventing infection? Hepatitis B If you have a higher risk for hepatitis B, you should be screened for this virus. Talk with your health care provider to find out if you are at risk for hepatitis B infection. Hepatitis C Blood testing is recommended for:  Everyone born from [birth year] through 1967.  Anyone with known risk factors for hepatitis C. Sexually transmitted infections (STIs)  You should be screened  each year for STIs, including gonorrhea and chlamydia, if: ? You are sexually active and are younger than 72 years of age. ? You are older than 71 years of age and your health care provider tells you that you are at risk for this type of infection. ? Your sexual activity has changed since you were last screened, and you are at increased risk for chlamydia or gonorrhea. Ask your health care provider if you are at risk.  Ask your  health care provider about whether you are at high risk for HIV. Your health care provider may recommend a prescription medicine to help prevent HIV infection. If you choose to take medicine to prevent HIV, you should first get tested for HIV. You should then be tested every 3 months for as long as you are taking the medicine. Follow these instructions at home: Lifestyle  Do not use any products that contain nicotine or tobacco, such as cigarettes, e-cigarettes, and chewing tobacco. If you need help quitting, ask your health care provider.  Do not use street drugs.  Do not share needles.  Ask your health care provider for help if you need support or information about quitting drugs. Alcohol use  Do not drink alcohol if your health care provider tells you not to drink.  If you drink alcohol: ? Limit how much you have to 0-2 drinks a day. ? Be aware of how much alcohol is in your drink. In the [place] one drink equals one 12 oz bottle of beer (355 mL), one 5 oz glass of wine (148 mL), or one 1 oz glass of hard liquor (44 mL). General instructions  Schedule regular health, dental, and eye exams.  Stay current with your vaccines.  Tell your health care provider if: ? You often feel depressed. ? You have ever been abused or do not feel safe at home. Summary  Adopting a healthy lifestyle and getting preventive care are important in promoting health and wellness.  Follow your health care provider's instructions about healthy diet, exercising, and getting tested or screened for diseases.  Follow your health care provider's instructions on monitoring your cholesterol and blood pressure. This information is not intended to replace advice given to you by your health care provider. Make sure you discuss any questions you have with your health care provider. Document Revised: 05/22/2018 Document Reviewed: 05/22/2018 Elsevier Patient Education  2021 [place].   Steps to Quit  Smoking Smoking tobacco is the leading cause of preventable death. It can affect almost every organ in the body. Smoking puts you and people around you at risk for many serious, long-lasting (chronic) diseases. Quitting smoking can be hard, but it is one of the best things that you can do for your health. It is never too late to quit. How do I get ready to quit? When you decide to quit smoking, make a plan to help you succeed. Before you quit:  Pick a date to quit. Set a date within the next 2 weeks to give you time to prepare.  Write down the reasons why you are quitting. Keep this list in places where you will see it often.  Tell your family, friends, and co-workers that you are quitting. Their support is important.  Talk with your doctor about the choices that may help you quit.  Find out if your health insurance will pay for these treatments.  Know the people, places, things, and activities that make you want to smoke (triggers). Avoid them. What  first steps can I take to quit smoking?  Throw away all cigarettes at home, at work, and in your car.  Throw away the things that you use when you smoke, such as ashtrays and lighters.  Clean your car. Make sure to empty the ashtray.  Clean your home, including curtains and carpets. What can I do to help me quit smoking? Talk with your doctor about taking medicines and seeing a counselor at the same time. You are more likely to succeed when you do both.  If you are pregnant or breastfeeding, talk with your doctor about counseling or other ways to quit smoking. Do not take medicine to help you quit smoking unless your doctor tells you to do so. To quit smoking: Quit right away  Quit smoking totally, instead of slowly cutting back on how much you smoke over a period of time.  Go to counseling. You are more likely to quit if you go to counseling sessions regularly. Take medicine You may take medicines to help you quit. Some medicines  need a prescription, and some you can buy over-the-counter. Some medicines may contain a drug called nicotine to replace the nicotine in cigarettes. Medicines may:  Help you to stop having the desire to smoke (cravings).  Help to stop the problems that come when you stop smoking (withdrawal symptoms). Your doctor may ask you to use:  Nicotine patches, gum, or lozenges.  Nicotine inhalers or sprays.  Non-nicotine medicine that is taken by mouth. Find resources Find resources and other ways to help you quit smoking and remain smoke-free after you quit. These resources are most helpful when you use them often. They include:  Online chats with a Social worker.  Phone quitlines.  Printed Furniture conservator/restorer.  Support groups or group counseling.  Text messaging programs.  Mobile phone apps. Use apps on your mobile phone or tablet that can help you stick to your quit plan. There are many free apps for mobile phones and tablets as well as websites. Examples include Quit Guide from the [employer] and smokefree.gov   What things can I do to make it easier to quit?  Talk to your family and friends. Ask them to support and encourage you.  Call a phone quitline (1-800-QUIT-NOW), reach out to support groups, or work with a Social worker.  Ask people who smoke to not smoke around you.  Avoid places that make you want to smoke, such as: ? Bars. ? Parties. ? Smoke-break areas at work.  Spend time with people who do not smoke.  Lower the stress in your life. Stress can make you want to smoke. Try these things to help your stress: ? Getting regular exercise. ? Doing deep-breathing exercises. ? Doing yoga. ? Meditating. ? Doing a body scan. To do this, close your eyes, focus on one area of your body at a time from head to toe. Notice which parts of your body are tense. Try to relax the muscles in those areas.   How will I feel when I quit smoking? Day 1 to 3 weeks Within the first 24 hours, you may  start to have some problems that come from quitting tobacco. These problems are very bad 2-3 days after you quit, but they do not often last for more than 2-3 weeks. You may get these symptoms:  Mood swings.  Feeling restless, nervous, angry, or annoyed.  Trouble concentrating.  Dizziness.  Strong desire for high-sugar foods and nicotine.  Weight gain.  Trouble pooping (constipation).  Feeling like you may vomit (nausea).  Coughing or a sore throat.  Changes in how the medicines that you take for other issues work in your body.  Depression.  Trouble sleeping (insomnia). Week 3 and afterward After the first 2-3 weeks of quitting, you may start to notice more positive results, such as:  Better sense of smell and taste.  Less coughing and sore throat.  Slower heart rate.  Lower blood pressure.  Clearer skin.  Better breathing.  Fewer sick days. Quitting smoking can be hard. Do not give up if you fail the first time. Some people need to try a few times before they succeed. Do your best to stick to your quit plan, and talk with your doctor if you have any questions or concerns. Summary  Smoking tobacco is the leading cause of preventable death. Quitting smoking can be hard, but it is one of the best things that you can do for your health.  When you decide to quit smoking, make a plan to help you succeed.  Quit smoking right away, not slowly over a period of time.  When you start quitting, seek help from your doctor, family, or friends. This information is not intended to replace advice given to you by your health care provider. Make sure you discuss any questions you have with your health care provider. Document Revised: 02/21/2019 Document Reviewed: 08/17/2018 Elsevier Patient Education  2021 [place] Maintenance Summary and Written Plan of Care  Timothy Perkins ,  Thank you for allowing me to perform your Medicare  Annual Wellness Visit and for your ongoing commitment to your health.   Health Maintenance & Immunization History Health Maintenance  Topic Date Due  . TETANUS/TDAP  04/21/2021 (Originally 09/24/1969)  . COVID-19 Vaccine (2 - Moderna risk 4-dose series) 07/23/2020  . INFLUENZA VACCINE  Completed  . Hepatitis C Screening  Completed  . PNA vac Low Risk Adult  Completed  . Fecal DNA (Cologuard)  Discontinued   Immunization History  Administered Date(s) Administered  . Fluad Quad(high Dose 65+) 04/21/2020  . Influenza, High Dose Seasonal PF 04/15/2018, 04/30/2019  . Influenza,inj,Quad PF,6+ Mos 06/16/2016  . Moderna Sars-Covid-2 Vaccination 06/25/2020  . Pneumococcal Conjugate-13 06/16/2016  . Pneumococcal Polysaccharide-23 04/15/2018    These are the patient goals that we discussed: Goals Addressed              This Visit's Progress   .  Patient Stated (pt-stated)        07/14/2020 AWV Goal: Tobacco Cessation  Smoking cessation instruction/counseling given:  counseled patient on the dangers of tobacco use, advised patient to stop smoking, and reviewed strategies to maximize success   Patient will verbalize understanding of the health risks associated with smoking/tobacco use  Lung cancer or lung disease, such as COPD  Heart disease.  Stroke.  Heart attack  Infertility  Osteoporosis and bone fractures. . Patient will create a plan to quit smoking/using tobacco  Pick a date to quit.   Write down the reasons why you are quitting and put it where you will see it often.  Identify the people, places, things, and activities that make you want to smoke (triggers) and avoid them. Make sure to take these actions: ? Throw away all cigarettes at home, at work, and in your car. ? Throw away smoking accessories, such as Scientist, research (medical). ? Clean your car and make sure to empty the ashtray. ? Clean your home,  including curtains and carpets.  Tell your family, friends,  and coworkers that you are quitting. Support from your loved ones can make quitting easier.  Talk with your health care provider about your options for quitting smoking.  Find out what treatment options are covered by your health insurance. . Patient will be able to demonstrate knowledge of tobacco cessation strategies that may maximize success  Quitting "cold [place]" is more successful than gradually quitting.  Attending in-person counseling to help you build problem-solving skills.   Finding resources and support systems that can help you to quit smoking and remain smoke-free after you quit. These resources are most helpful when you use them often. They can include: ? Online chats with a Social worker. ? Telephone quitlines. ? Careers information officer. ? Support groups or group counseling. ? Text messaging programs. ? Mobile phone applications.  Taking medicines to help you quit smoking: ? Nicotine patches, gum, or lozenges. ? Nicotine inhalers or sprays. ? Non-nicotine medicine that is taken by mouth. . Patient will note get discouraged if the process is difficult . Over the next year, patient will stop smoking or using other forms of tobacco  Smoking cessation instruction/counseling given:  counseled patient on the dangers of tobacco use, advised patient to stop smoking, and reviewed strategies to maximize success          This is a list of Health Maintenance Items that are overdue or due now: Smoking cessation counseling  Orders/Referrals Placed Today: No orders of the defined types were placed in this encounter.  Follow-up Plan . Follow-up with Donella Stade, PA-C as planned . Schedule your appointment at the pharmacy for your shingrix vaccine.  . Schedule your eye doctor appointment.  . Tetanus shot is due in Nov, 2022. (make sure to schedule that at your pharmacy).

## 2020-10-03 ENCOUNTER — Other Ambulatory Visit: Payer: Self-pay | Admitting: Physician Assistant

## 2020-10-03 DIAGNOSIS — J302 Other seasonal allergic rhinitis: Secondary | ICD-10-CM

## 2020-12-09 ENCOUNTER — Ambulatory Visit: Payer: Medicare HMO | Admitting: Family Medicine

## 2020-12-19 ENCOUNTER — Encounter: Payer: Self-pay | Admitting: Physician Assistant

## 2020-12-19 DIAGNOSIS — J42 Unspecified chronic bronchitis: Secondary | ICD-10-CM

## 2020-12-20 MED ORDER — TRELEGY ELLIPTA 100-62.5-25 MCG/INH IN AEPB: 1.0000 | INHALATION_SPRAY | Freq: Every day | RESPIRATORY_TRACT | 3 refills | Status: DC

## 2020-12-20 NOTE — Telephone Encounter (Signed)
Can we see about samples and getting him the paperwork to fill out for assistance?

## 2020-12-22 DIAGNOSIS — Z0100 Encounter for examination of eyes and vision without abnormal findings: Secondary | ICD-10-CM | POA: Diagnosis not present

## 2020-12-22 MED ORDER — TRELEGY ELLIPTA 100-62.5-25 MCG/INH IN AEPB: 1.0000 | INHALATION_SPRAY | Freq: Every day | RESPIRATORY_TRACT | 0 refills | Status: DC

## 2020-12-22 NOTE — Addendum Note (Signed)
Addended byAnnamaria Helling on: 12/22/2020 11:51 AM   Modules accepted: Orders

## 2021-02-16 ENCOUNTER — Telehealth: Payer: Self-pay | Admitting: Lab

## 2021-02-16 NOTE — Chronic Care Management (AMB) (Signed)
  Chronic Care Management   Note  02/16/2021 Name: Timothy Perkins MRN: [id number] DOB: [birth year]  DAH Timothy Perkins is a 70 y.o. year old male who is a primary care patient of Donella Stade, [place]. I reached out to Timothy Perkins by phone today in response to a referral sent by [name]'s PCP, Donella Stade, PA-C.   Timothy Perkins was given information about Chronic Care Management services today including:  CCM service includes personalized support from designated clinical staff supervised by his physician, including individualized plan of care and coordination with other care providers 24/7 contact phone numbers for assistance for urgent and routine care needs. Service will only be billed when office clinical staff spend 20 minutes or more in a month to coordinate care. Only one practitioner may furnish and bill the service in a calendar month. The patient may stop CCM services at any time (effective at the end of the month) by phone call to the office staff.   Patient agreed to services and verbal consent obtained.   Follow up plan:   [place]

## 2021-03-16 ENCOUNTER — Telehealth: Payer: Self-pay

## 2021-03-16 NOTE — Chronic Care Management (AMB) (Signed)
    Chronic Care Management Pharmacy Assistant   Name: [name]  MRN: [id number] DOB: February 01, 1951  Timothy Perkins is an 70 y.o. year old male who presents for his initial CCM visit with the clinical pharmacist.   Recent office visits:  None noted   Recent consult visits:  12/22/20 [facility] for routine examination - No notes available   Hospital visits:  None in previous 6 months  Medications: Outpatient Encounter Medications as of 03/16/2021  Medication Sig   albuterol (PROVENTIL HFA;VENTOLIN HFA) 108 (90 Base) MCG/ACT inhaler Inhale 2 puffs into the lungs every 6 (six) hours as needed for wheezing or shortness of breath.   cetirizine (ZYRTEC) 10 MG tablet TAKE 1 TABLET EVERY DAY   famotidine (PEPCID) 20 MG tablet Take 1 tablet (20 mg total) by mouth 2 (two) times daily. (Patient taking differently: Take 20 mg by mouth 2 (two) times daily. Takes it once a day.)   Fluticasone-Umeclidin-Vilant (TRELEGY ELLIPTA) 100-62.5-25 MCG/INH AEPB Inhale 1 puff into the lungs daily.   ketoconazole (NIZORAL) 2 % shampoo Apply 1 application topically daily. For hair and body. Allow to set 5 minutes before rinsing. Use for at least 7 days. (Patient not taking: Reported on 07/14/2020)   losartan (COZAAR) 50 MG tablet Start with one tablet daily. (Patient not taking: Reported on 07/14/2020)   No facility-administered encounter medications on file as of 03/16/2021.    Care Gaps: Zoster Vaccines- Shingrix Overdue - never done  COVID-19 Vaccine (2 - Moderna risk series) Last completed: Jun 25, 2020 INFLUENZA VACCINE Last completed: Apr 21, 2020  albuterol (PROVENTIL HFA;VENTOLIN [facility]) 108 [phone] Base) MCG/ACT inhaler - Last fill 02/25/16 cetirizine (ZYRTEC) 10 MG tablet- Last fill 05/21/2018 90 DS famotidine (PEPCID) 20 MG tablet- Last fill 10/13/2020 90 DS Fluticasone-Umeclidin-Vilant (TRELEGY ELLIPTA) 100-62.5-25 MCG/INH AEPB- Last fill 12/22/20 90 DS ketoconazole (NIZORAL) 2 %  shampoo- Last fill 09/09/2019 30 DS losartan (COZAAR) 50 MG tablet- Last fill 04/21/2020 90 DS    Star Rating Drugs: losartan (COZAAR) 50 MG tablet- Last fill 04/21/2020 90 DS    Andee Poles, CMA

## 2021-03-24 ENCOUNTER — Ambulatory Visit (INDEPENDENT_AMBULATORY_CARE_PROVIDER_SITE_OTHER): Payer: Medicare HMO | Admitting: Pharmacist

## 2021-03-24 ENCOUNTER — Other Ambulatory Visit: Payer: Self-pay

## 2021-03-24 DIAGNOSIS — F17200 Nicotine dependence, unspecified, uncomplicated: Secondary | ICD-10-CM

## 2021-03-24 DIAGNOSIS — I10 Essential (primary) hypertension: Secondary | ICD-10-CM

## 2021-03-24 DIAGNOSIS — E1169 Type 2 diabetes mellitus with other specified complication: Secondary | ICD-10-CM

## 2021-03-24 NOTE — Progress Notes (Signed)
Chronic Care Management Pharmacy Note  03/24/2021 Name:  Timothy Perkins MRN:  [id number] DOB:  12-19-1950  Summary: addressed HTN, COPD, tobacco use. Patient refuses to take losartan as it makes him feel dizzy. He knows his BP is higher but "is okay with that." Counseled patient on goals of blood pressure and connected to downstream effects of uncontrolled BP. Also does not purchase trelegy when in the donut hole, I offered patient assistance support but patient declined at this time.  Recommendations/Changes made from today's visit: Consider amlodipine 2.5mg  daily in the future if he is willing.   Plan: f/u with pharmacist in 1 year (patient did not wish for any sooner frequency to assist w/ BP control or trelegy cost help).  Subjective: Timothy Perkins is an 70 y.o. year old male who is a primary patient of Alden Hipp, Royetta Car, PA-C.  The CCM team was consulted for assistance with disease management and care coordination needs.    Engaged with patient by telephone for initial visit in response to provider referral for pharmacy case management and/or care coordination services.   Consent to Services:  The patient was given information about Chronic Care Management services, agreed to services, and gave verbal consent prior to initiation of services.  Please see initial visit note for detailed documentation.   Patient Care Team: Lavada Mesi as PCP - General (Family Medicine) Darius Bump, [facility] as Pharmacist (Pharmacist)  Recent office visits:  None noted    Recent consult visits:  12/22/20 [place] for routine examination - No notes available    Hospital visits:  None in previous 6 months  Objective:  Lab Results  Component Value Date   CREATININE 0.77 10/08/2019    No results found for: HGBA1C Last diabetic Eye exam: No results found for: HMDIABEYEEXA  Last diabetic Foot exam: No results found for: HMDIABFOOTEX   No results found for:  CHOL, TRIG, HDL, CHOLHDL, VLDL, LDLCALC, LDLDIRECT  Hepatic Function Latest Ref Rng & Units 10/08/2019  Total Protein 6.1 - 8.1 g/dL 6.7  AST 10 - 35 U/L 21  ALT 9 - 46 U/L 26  Total Bilirubin 0.2 - 1.2 mg/dL 0.7    No results found for: TSH, FREET4  CBC Latest Ref Rng & Units 10/08/2019  WBC 3.8 - 10.8 Thousand/uL 8.6  Hemoglobin 13.2 - 17.1 g/dL 16.7  Hematocrit 38.5 - 50.0 % 47.7  Platelets 140 - 400 Thousand/uL 175     Social History   Tobacco Use  Smoking Status Every Day   Packs/day: 1.00   Years: 50.00   Pack years: 50.00   Types: Cigarettes  Smokeless Tobacco Never   BP Readings from Last 3 Encounters:  04/21/20 (!) 158/68  10/08/19 (!) 169/88  09/18/19 (!) 158/84   Pulse Readings from Last 3 Encounters:  04/21/20 72  10/08/19 72  09/18/19 74   Wt Readings from Last 3 Encounters:  04/21/20 169 lb (76.7 kg)  10/08/19 171 lb 4.8 oz (77.7 kg)  09/18/19 166 lb (75.3 kg)    Assessment: Review of patient past medical history, allergies, medications, health status, including review of consultants reports, laboratory and other test data, was performed as part of comprehensive evaluation and provision of chronic care management services.   SDOH:  (Social Determinants of Health) assessments and interventions performed:    CCM Care Plan  Allergies  Allergen Reactions   Lisinopril     "loopy"    Medications Reviewed  Today     Reviewed by Tinnie Gens, RN (Registered Nurse) on 07/14/20 at 1032  Med List Status: <None>   Medication Order Taking? Sig Documenting Provider Last Dose Status Informant  albuterol (PROVENTIL HFA;VENTOLIN HFA) 108 (90 Base) MCG/ACT inhaler [id number] Yes Inhale 2 puffs into the lungs every 6 (six) hours as needed for wheezing or shortness of breath. Donella Stade, PA-C Taking Active   cetirizine (ZYRTEC) 10 MG tablet [id number] Yes TAKE 1 TABLET EVERY DAY Breeback, Jade L, PA-C Taking Active   famotidine (PEPCID) 20 MG tablet  [id number] Yes Take 1 tablet (20 mg total) by mouth 2 (two) times daily.  Patient taking differently: Take 20 mg by mouth 2 (two) times daily. Takes it once a day.   Donella Stade, PA-C Taking Active Self  Fluticasone-Umeclidin-Vilant (TRELEGY ELLIPTA) 100-62.5-25 MCG/INH AEPB [id number] Yes Inhale 1 puff into the lungs daily. Donella Stade, PA-C Taking Active   ketoconazole (NIZORAL) 2 % shampoo [id number] No Apply 1 application topically daily. For hair and body. Allow to set 5 minutes before rinsing. Use for at least 7 days.  Patient not taking: Reported on 07/14/2020   Orma Render, NP Not Taking Active   losartan (COZAAR) 50 MG tablet [id number] No Start with one tablet daily.  Patient not taking: Reported on 07/14/2020   Lavada Mesi Not Taking Active Self            Patient Active Problem List   Diagnosis Date Noted   Current smoker 04/23/2020   Melanoma of skin ([place]) 04/19/2018   Solar lentigo 04/17/2018   Actinic keratosis 04/17/2018   Gastroesophageal reflux disease with esophagitis 02/22/2018   Seasonal allergies 02/22/2018   Essential hypertension, benign 06/16/2016   COPD (chronic obstructive pulmonary disease) with chronic bronchitis ([place]) 03/08/2016   COPD exacerbation ([place]) 02/25/2016   Tobacco dependency 02/25/2016   Anxiety state 09/18/2014   Gastritis and duodenitis 01/08/2014   Alcohol abuse 01/07/2014    Immunization History  Administered Date(s) Administered   Fluad Quad(high Dose 65+) 04/21/2020   Influenza, High Dose Seasonal PF 04/15/2018, 04/30/2019   Influenza,inj,Quad PF,6+ Mos 06/16/2016   Moderna Sars-Covid-2 Vaccination 06/25/2020   Pneumococcal Conjugate-13 06/16/2016   Pneumococcal Polysaccharide-23 04/15/2018    Conditions to be addressed/monitored: HTN, COPD, and tobacco use  There are no care plans that you recently modified to display for this patient.   Medication Assistance:  Trelegy is cost prohibitve but patient  declined offer for patient assistance.  Patient's preferred pharmacy is:  [facility] [address], [place] [address] Phone: [phone] Fax: [phone]  [id number] [address] [place] - [address] [place] [address] Phone: [phone] Fax: [phone]  Uses pill box? No - has a system   Pt endorses 100% compliance  Follow Up:  Patient agrees to Care Plan and Follow-up.  Plan: Telephone follow up appointment with care management team member scheduled for:  1 year  Larinda Buttery, PharmD Clinical Pharmacist [facility] [phone]

## 2021-03-24 NOTE — Patient Instructions (Signed)
Visit Information   PATIENT GOALS:   Goals Addressed             This Visit's Progress    Medication Management       Patient Goals/Self-Care Activities Over the next 365 days, patient will:  take medications as prescribed and collaborate with provider on medication access solutions  Follow Up Plan: Telephone follow up appointment with care management team member scheduled for:  1 year         Consent to CCM Services: Timothy Perkins was given information about Chronic Care Management services including:  CCM service includes personalized support from designated clinical staff supervised by his physician, including individualized plan of care and coordination with other care providers 24/7 contact phone numbers for assistance for urgent and routine care needs. Service will only be billed when office clinical staff spend 20 minutes or more in a month to coordinate care. Only one practitioner may furnish and bill the service in a calendar month. The patient may stop CCM services at any time (effective at the end of the month) by phone call to the office staff. The patient will be responsible for cost sharing (co-pay) of up to 20% of the service fee (after annual deductible is met).  Patient agreed to services and verbal consent obtained.   Patient verbalizes understanding of instructions provided today and agrees to view in [place].   Telephone follow up appointment with care management team member scheduled for: 1 year Darius Bump   CLINICAL CARE PLAN: Patient Care Plan: Medication Management     Problem Identified: HTN, COPD, tobacco use      Long-Range Goal: Disease Progression Prevention   Start Date: 03/24/2021  This Visit's Progress: On track  Priority: High  Note:   Current Barriers:  Does not adhere to prescribed medication regimen Does not maintain contact with provider office Does not contact provider office for questions/concerns  Pharmacist Clinical  Goal(s):  Over the next 365 days, patient will adhere to prescribed medication regimen as evidenced by medication fill history through collaboration with PharmD and provider.   Interventions: 1:1 collaboration with Donella Stade, PA-C regarding development and update of comprehensive plan of care as evidenced by provider attestation and co-signature Inter-disciplinary care team collaboration (see longitudinal plan of care) Comprehensive medication review performed; medication list updated in electronic medical record  Hypertension:  Uncontrolled; current treatment:losartan 42m daily (not taking, states making dizzy);   Current home readings: does not check at home    Denies hypotensive/hypertensive symptoms  Educated on importance of BP control and reinforced goal of at least SBP <140 Recommended patient continue to considering alternate BP medication to aim for lower blood pressure without the dizzy feeling. May consider amlodipine 2.515mdaily. ,  Chronic Obstructive Pulmonary Disease:  Controlled; current treatment:trelegy 1 puff daily, albuterol PRN;   Recommended continue current regimen, patient states he does not purchase trellegy when in donut hole, I offered to assist with patient assistance for cost, patient declined at this time,  Tobacco Abuse:  1/2 packs per day; 58 years of use; does/does not smoke within 30 minutes of waking up. States try to smoke 1 per hour, rations himself.  Previous quit attempts: successful using cold [place] 2 yr due to wife illness, started back once she had passed   Triggers to smoke: calms him down  Motivation to quit smoking: none  On a scale of 1-10, how IMPORTANT is it for you to quit smoking: N/A  On a scale of 1-10, how CONFIDENT are you that you can quit smoking: N/A  Counseled on various options for assisting with smoking cessation Recommended that patient let us know if he ever desires assistance with smoking cessation  Patient  Goals/Self-Care Activities Over the next 365 days, patient will:  take medications as prescribed and collaborate with provider on medication access solutions  Follow Up Plan: Telephone follow up appointment with care management team member scheduled for:  1 year

## 2021-04-11 DIAGNOSIS — E785 Hyperlipidemia, unspecified: Secondary | ICD-10-CM | POA: Diagnosis not present

## 2021-04-11 DIAGNOSIS — I10 Essential (primary) hypertension: Secondary | ICD-10-CM

## 2021-04-11 DIAGNOSIS — E1169 Type 2 diabetes mellitus with other specified complication: Secondary | ICD-10-CM

## 2021-05-17 ENCOUNTER — Other Ambulatory Visit: Payer: Self-pay | Admitting: Physician Assistant

## 2021-05-17 ENCOUNTER — Encounter: Payer: Self-pay | Admitting: Physician Assistant

## 2021-05-17 DIAGNOSIS — K219 Gastro-esophageal reflux disease without esophagitis: Secondary | ICD-10-CM

## 2021-05-17 DIAGNOSIS — J302 Other seasonal allergic rhinitis: Secondary | ICD-10-CM

## 2021-05-20 ENCOUNTER — Other Ambulatory Visit: Payer: Self-pay | Admitting: Neurology

## 2021-05-20 MED ORDER — LISINOPRIL 2.5 MG PO TABS: 2.5000 mg | ORAL_TABLET | Freq: Every day | ORAL | 1 refills | Status: DC

## 2021-05-20 MED ORDER — LISINOPRIL 2.5 MG PO TABS
2.5000 mg | ORAL_TABLET | Freq: Every day | ORAL | 0 refills | Status: DC
Start: 2021-05-20 — End: 2021-06-10

## 2021-05-20 NOTE — Progress Notes (Signed)
Patient asked for 30 day supply of [facility]opril [facility]. RX sent.

## 2021-05-25 ENCOUNTER — Other Ambulatory Visit: Payer: Self-pay

## 2021-05-25 ENCOUNTER — Encounter: Payer: Self-pay | Admitting: Physician Assistant

## 2021-05-25 ENCOUNTER — Ambulatory Visit (INDEPENDENT_AMBULATORY_CARE_PROVIDER_SITE_OTHER): Payer: Medicare HMO | Admitting: Physician Assistant

## 2021-05-25 VITALS — BP 162/68 | HR 72 | Ht 72.0 in | Wt 166.0 lb

## 2021-05-25 DIAGNOSIS — E785 Hyperlipidemia, unspecified: Secondary | ICD-10-CM | POA: Diagnosis not present

## 2021-05-25 DIAGNOSIS — L570 Actinic keratosis: Secondary | ICD-10-CM

## 2021-05-25 DIAGNOSIS — I10 Essential (primary) hypertension: Secondary | ICD-10-CM | POA: Diagnosis not present

## 2021-05-25 DIAGNOSIS — J449 Chronic obstructive pulmonary disease, unspecified: Secondary | ICD-10-CM | POA: Diagnosis not present

## 2021-05-25 DIAGNOSIS — Z79899 Other long term (current) drug therapy: Secondary | ICD-10-CM | POA: Diagnosis not present

## 2021-05-25 DIAGNOSIS — E1169 Type 2 diabetes mellitus with other specified complication: Secondary | ICD-10-CM | POA: Diagnosis not present

## 2021-05-25 DIAGNOSIS — R8290 Unspecified abnormal findings in urine: Secondary | ICD-10-CM | POA: Diagnosis not present

## 2021-05-25 DIAGNOSIS — F17200 Nicotine dependence, unspecified, uncomplicated: Secondary | ICD-10-CM | POA: Diagnosis not present

## 2021-05-25 DIAGNOSIS — Z122 Encounter for screening for malignant neoplasm of respiratory organs: Secondary | ICD-10-CM | POA: Diagnosis not present

## 2021-05-25 DIAGNOSIS — Z125 Encounter for screening for malignant neoplasm of prostate: Secondary | ICD-10-CM

## 2021-05-25 DIAGNOSIS — Z1329 Encounter for screening for other suspected endocrine disorder: Secondary | ICD-10-CM

## 2021-05-25 LAB — POCT URINALYSIS DIP (CLINITEK)
Bilirubin, UA: NEGATIVE
Blood, UA: NEGATIVE
Glucose, UA: NEGATIVE mg/dL
Ketones, POC UA: NEGATIVE mg/dL
Leukocytes, UA: NEGATIVE
Nitrite, UA: NEGATIVE
POC PROTEIN,UA: NEGATIVE
Spec Grav, UA: 1.02 (ref 1.010–1.025)
Urobilinogen, UA: 0.2 E.U./dL
pH, UA: 7 (ref 5.0–8.0)

## 2021-05-25 NOTE — Progress Notes (Signed)
° °  Subjective:    Patient ID: Timothy Perkins, male    DOB: [birth year], 70 y.o.   MRN: [id number]  HPI Pt is a 70 yo male with HTN, COPD, GERD who presents to the clinic to follow up and get medication refills.      Review of Systems     Objective:   Physical Exam        Assessment & Plan:  Marland KitchenMarland KitchenTerran was seen today for follow-up.  Diagnoses and all orders for this visit:  Essential hypertension, benign  Hyperlipidemia LDL goal <100 -     Lipid Panel w/reflex Direct LDL -     atorvastatin (LIPITOR) 40 MG tablet; Take 1 tablet (40 mg total) by mouth daily.  Screening PSA (prostate specific antigen) -     PSA  Thyroid disorder screen -     TSH  Medication management -     PSA -     TSH -     Lipid Panel w/reflex Direct LDL -     COMPLETE METABOLIC PANEL WITH GFR -     CBC with Differential/Platelet  Abnormal urine odor -     POCT URINALYSIS DIP (CLINITEK) -     Urine Culture  Current smoker -     Ambulatory Referral for Lung Cancer Scre  Screening for lung cancer -     Ambulatory Referral for Lung Cancer Scre  Actinic keratosis  Other orders -     Fluticasone-Umeclidin-Vilant (TRELEGY ELLIPTA) 100-62.5-25 MCG/ACT AEPB; Inhale 1 puff into the lungs daily.  Need fasting labs for risk assessment.   BP not to goal.  Increased lisinopril to 5mg  daily. Take BP at home. Goal BP with smoking hx and cholesterol is under 130/80.  Recheck BP in 2 weeks.   COPD- on trelegy and doing well. Using albuterol only has needed. Continues to be active and denies any SOB. Pulse ox 100 percent.   Pt declines smoking cessation. Lung cancer screening ordered.   Statin for LDL goal of under 100.

## 2021-05-25 NOTE — Patient Instructions (Signed)
Take 5mg  of lisinopril daily.

## 2021-05-26 LAB — CBC WITH DIFFERENTIAL/PLATELET
Absolute Monocytes: 532 cells/uL (ref 200–950)
Basophils Absolute: 53 cells/uL (ref 0–200)
Basophils Relative: 0.7 %
Eosinophils Absolute: 190 cells/uL (ref 15–500)
Eosinophils Relative: 2.5 %
HCT: 45.3 % (ref 38.5–50.0)
Hemoglobin: 15.7 g/dL (ref 13.2–17.1)
Lymphs Abs: 2516 cells/uL (ref 850–3900)
MCH: 33.5 pg — ABNORMAL HIGH (ref 27.0–33.0)
MCHC: 34.7 g/dL (ref 32.0–36.0)
MCV: 96.8 fL (ref 80.0–100.0)
MPV: 11.2 fL (ref 7.5–12.5)
Monocytes Relative: 7 %
Neutro Abs: 4309 cells/uL (ref 1500–7800)
Neutrophils Relative %: 56.7 %
Platelets: 167 10*3/uL (ref 140–400)
RBC: 4.68 10*6/uL (ref 4.20–5.80)
RDW: 12.3 % (ref 11.0–15.0)
Total Lymphocyte: 33.1 %
WBC: 7.6 10*3/uL (ref 3.8–10.8)

## 2021-05-26 LAB — LIPID PANEL W/REFLEX DIRECT LDL
Cholesterol: 216 mg/dL — ABNORMAL HIGH (ref <200)
HDL: 52 mg/dL (ref >=40)
LDL Cholesterol (Calc): 136 mg/dL (calc) — ABNORMAL HIGH
Non-HDL Cholesterol (Calc): 164 mg/dL (calc) — ABNORMAL HIGH (ref <130)
Total CHOL/HDL Ratio: 4.2 (calc) (ref <5.0)
Triglycerides: 153 mg/dL — ABNORMAL HIGH (ref <150)

## 2021-05-26 LAB — COMPLETE METABOLIC PANEL WITH GFR
AG Ratio: 1.8 (calc) (ref 1.0–2.5)
ALT: 23 U/L (ref 9–46)
AST: 20 U/L (ref 10–35)
Albumin: 4.2 g/dL (ref 3.6–5.1)
Alkaline phosphatase (APISO): 84 U/L (ref 35–144)
BUN: 10 mg/dL (ref 7–25)
CO2: 29 mmol/L (ref 20–32)
Calcium: 9.7 mg/dL (ref 8.6–10.3)
Chloride: 103 mmol/L (ref 98–110)
Creat: 0.8 mg/dL (ref 0.70–1.28)
Globulin: 2.4 g/dL (calc) (ref 1.9–3.7)
Glucose, Bld: 96 mg/dL (ref 65–139)
Potassium: 5.2 mmol/L (ref 3.5–5.3)
Sodium: 138 mmol/L (ref 135–146)
Total Bilirubin: 0.8 mg/dL (ref 0.2–1.2)
Total Protein: 6.6 g/dL (ref 6.1–8.1)
eGFR: 94 mL/min/{1.73_m2} (ref >=60)

## 2021-05-26 LAB — TSH: TSH: 1.11 mIU/L (ref 0.40–4.50)

## 2021-05-26 LAB — PSA: PSA: 1.05 ng/mL (ref <=4.00)

## 2021-05-30 ENCOUNTER — Encounter: Payer: Self-pay | Admitting: Physician Assistant

## 2021-05-30 DIAGNOSIS — E785 Hyperlipidemia, unspecified: Secondary | ICD-10-CM | POA: Insufficient documentation

## 2021-05-30 MED ORDER — ATORVASTATIN CALCIUM 40 MG PO TABS
40.0000 mg | ORAL_TABLET | Freq: Every day | ORAL | 3 refills | Status: DC
Start: 2021-05-30 — End: 2021-10-03

## 2021-05-30 NOTE — Progress Notes (Signed)
Sent!

## 2021-05-30 NOTE — Progress Notes (Signed)
Timothy Perkins,   PSA looks great.  Thyroid wonderful.  Kidney, liver, glucose look great.  CBC looks great.  Your CV risk is very elevated at 31 percent risk in 10 years. You need to start a cholesterol medication and work to get your BP to goal. Are you ok  with starting a statin?

## 2021-06-01 ENCOUNTER — Encounter: Payer: Self-pay | Admitting: Physician Assistant

## 2021-06-07 ENCOUNTER — Telehealth: Payer: Self-pay | Admitting: Physician Assistant

## 2021-06-07 DIAGNOSIS — E785 Hyperlipidemia, unspecified: Secondary | ICD-10-CM | POA: Insufficient documentation

## 2021-06-07 DIAGNOSIS — C439 Malignant melanoma of skin, unspecified: Secondary | ICD-10-CM

## 2021-06-07 DIAGNOSIS — L570 Actinic keratosis: Secondary | ICD-10-CM

## 2021-06-07 DIAGNOSIS — L814 Other melanin hyperpigmentation: Secondary | ICD-10-CM

## 2021-06-07 MED ORDER — TRELEGY ELLIPTA 100-62.5-25 MCG/ACT IN AEPB: 1.0000 | INHALATION_SPRAY | Freq: Every day | RESPIRATORY_TRACT | 3 refills | Status: DC

## 2021-06-07 NOTE — Telephone Encounter (Signed)
Reviewing patients chart before closed. Biospy of nevus showed some melanocytes that were abnormal from skin. Did he ever go to dermatology?

## 2021-06-07 NOTE — Telephone Encounter (Signed)
Called patient. He never went to dermatology. Referral sent again and it states he will go this time. [place].

## 2021-06-10 ENCOUNTER — Ambulatory Visit (INDEPENDENT_AMBULATORY_CARE_PROVIDER_SITE_OTHER): Payer: Medicare HMO | Admitting: Physician Assistant

## 2021-06-10 ENCOUNTER — Other Ambulatory Visit: Payer: Self-pay

## 2021-06-10 VITALS — BP 134/61 | HR 87

## 2021-06-10 DIAGNOSIS — I10 Essential (primary) hypertension: Secondary | ICD-10-CM

## 2021-06-10 MED ORDER — LISINOPRIL 5 MG PO TABS: 5.0000 mg | ORAL_TABLET | Freq: Every day | ORAL | 0 refills | Status: DC

## 2021-06-10 NOTE — Progress Notes (Signed)
Patient ID: Timothy Perkins, male   DOB: Apr 04, 1951, 70 y.o.   MRN: [id number]  BP to goal.  Sent lisinopril 5mg  to pharmacy.  Follow up in 3 months.   Marland KitchenLegrand Perkins was seen today for hypertension.  Diagnoses and all orders for this visit:  Essential hypertension, benign -     lisinopril (ZESTRIL) 5 MG tablet; Take 1 tablet (5 mg total) by mouth daily.

## 2021-06-10 NOTE — Progress Notes (Signed)
Pt here for nurse BP check.  Pt denies CP, SOB, headaches, dizziness or missed doses of medications.  Pt does not have a follow up appt scheduled.  Please advise when he needs to return.  Charyl Bigger, CMA

## 2021-06-13 ENCOUNTER — Other Ambulatory Visit: Payer: Self-pay | Admitting: Physician Assistant

## 2021-06-16 ENCOUNTER — Telehealth: Payer: Self-pay | Admitting: Neurology

## 2021-06-16 NOTE — Telephone Encounter (Signed)
Patient left a vm stating he has some sort of cold and wants an appt with a provider. Please call him to schedule. [phone].

## 2021-06-16 NOTE — Telephone Encounter (Signed)
Someone has already scheduled patient for tomorrow with Luvenia Starch. AM

## 2021-06-17 ENCOUNTER — Ambulatory Visit (INDEPENDENT_AMBULATORY_CARE_PROVIDER_SITE_OTHER): Payer: Medicare HMO | Admitting: Physician Assistant

## 2021-06-17 ENCOUNTER — Other Ambulatory Visit: Payer: Self-pay

## 2021-06-17 ENCOUNTER — Other Ambulatory Visit: Payer: Self-pay | Admitting: Physician Assistant

## 2021-06-17 VITALS — BP 142/66 | HR 80 | Temp 97.4°F | Ht 72.0 in | Wt 167.0 lb

## 2021-06-17 DIAGNOSIS — J441 Chronic obstructive pulmonary disease with (acute) exacerbation: Secondary | ICD-10-CM

## 2021-06-17 MED ORDER — DOXYCYCLINE HYCLATE 100 MG PO TABS
100.0000 mg | ORAL_TABLET | Freq: Two times a day (BID) | ORAL | 0 refills | Status: DC
Start: 2021-06-17 — End: 2021-07-04

## 2021-06-17 MED ORDER — PREDNISONE 50 MG PO TABS: ORAL_TABLET | ORAL | 0 refills | Status: DC

## 2021-06-17 NOTE — Progress Notes (Signed)
° °  Subjective:    Patient ID: Timothy Perkins, male    DOB: 1950/10/16, 71 y.o.   MRN: [id number]  HPI Pt is a 71 yo male with HTN, COPD, GERD who presents to the clinic with cough, sinus drainage, chest tightess, SOB for the last 2 weeks. His girlfriend is also sick with similar symptoms. Negative home covid test. No fever or body aches. Had had some chills and low grade temperature early on. Continues to smoke. On Trelegy. Using his albuterol inhaler multiple times a day.   . Active Ambulatory Problems    Diagnosis Date Noted   Alcohol abuse 01/07/2014   Gastritis and duodenitis 01/08/2014   Anxiety state 09/18/2014   COPD exacerbation ([place]) 02/25/2016   Tobacco dependency 02/25/2016   COPD (chronic obstructive pulmonary disease) with chronic bronchitis ([place]) 03/08/2016   Essential hypertension, benign 06/16/2016   Gastroesophageal reflux disease with esophagitis 02/22/2018   Seasonal allergies 02/22/2018   Solar lentigo 04/17/2018   Actinic keratosis 04/17/2018   Melanoma of skin ([place]) 04/19/2018   Current smoker 04/23/2020   Hyperlipidemia 05/30/2021   Hyperlipidemia LDL goal <100 06/07/2021   Resolved Ambulatory Problems    Diagnosis Date Noted   Depression 01/07/2014   Past Medical History:  Diagnosis Date   COPD (chronic obstructive pulmonary disease) (H[facility])    Hypertension    Lazy eye of left side        Review of Systems See HPI.     Objective:   Physical Exam Vitals reviewed.  Constitutional:      Appearance: Normal appearance.  HENT:     Head: Normocephalic.     Right Ear: Tympanic membrane normal.     Left Ear: Tympanic membrane normal.     Nose: Congestion present.     Mouth/Throat:     Mouth: Mucous membranes are moist.     Pharynx: Posterior oropharyngeal erythema present.  Eyes:     Extraocular Movements: Extraocular movements intact.     Conjunctiva/sclera: Conjunctivae normal.     Pupils: Pupils are equal, round, and reactive to light.   Cardiovascular:     Rate and Rhythm: Normal rate and regular rhythm.     Pulses: Normal pulses.  Pulmonary:     Effort: Pulmonary effort is normal.     Comments: Coarse breath sounds in bilateral lungs Musculoskeletal:     Cervical back: Normal range of motion and neck supple.     Right lower leg: No edema.     Left lower leg: No edema.  Lymphadenopathy:     Cervical: No cervical adenopathy.  Neurological:     General: No focal deficit present.     Mental Status: He is alert and oriented to person, place, and time.  Psychiatric:        Mood and Affect: Mood normal.          Assessment & Plan:  Marland KitchenMarland KitchenCrue was seen today for cough.  Diagnoses and all orders for this visit:  COPD exacerbation ([place]) -     doxycycline (VIBRA-TABS) 100 MG tablet; Take 1 tablet (100 mg total) by mouth 2 (two) times daily. -     predniSONE (DELTASONE) 50 MG tablet; One tab PO daily for 5 days.   Treated with doxy and prednisone. Continue albuterol inhaler as needed and trelegy daily.  Rest and hydrate Ok to stay on mucinex twice a day Declined smoking cessation Follow up as needed.

## 2021-06-17 NOTE — Patient Instructions (Signed)
Mucinex twice a day.   Chronic Obstructive Pulmonary Disease Chronic obstructive pulmonary disease (COPD) is a long-term (chronic) condition that affects the lungs. COPD is a general term that can be used to describe many different lung problems that cause lung inflammation and limit airflow, including chronic bronchitis and emphysema. If you have COPD, your lung function will probably never return to normal. In most cases, it gets worse over time. However, there are steps you can take to slow the progression of the disease and improve your quality of life. What are the causes? This condition may be caused by: Smoking. This is the most common cause. Certain genes passed down through families. What increases the risk? The following factors may make you more likely to develop this condition: Being exposed to secondhand smoke from cigarettes, pipes, or cigars. Being exposed to chemicals and other irritants, such as fumes and dust in the work environment. Having chronic lung conditions or infections. What are the signs or symptoms? Symptoms of this condition include: Shortness of breath, especially during physical activity. Chronic cough with a large amount of thick mucus. Sometimes, the cough may not have any mucus (dry cough). Wheezing and rapid breathing. Gray or bluish discoloration (cyanosis) of the skin, especially in the fingers, toes, or lips. Feeling tired (fatigue). Weight loss. Chest tightness. Frequent infections. Episodes when breathing symptoms become much worse (exacerbations). At the later stages of this disease, you may have swelling in the ankles, feet, or legs. How is this diagnosed? This condition is diagnosed based on: Your medical history. A physical exam. You may also have tests, including: Lung (pulmonary) function tests. This may include a spirometry test, which measures your ability to exhale properly. Chest X-ray. CT scan. Blood tests. How is this  treated? This condition may be treated with: Medicines. These may include inhaled rescue medicines to treat acute exacerbations as well as medicines that you take long-term (maintenance medicines) to prevent flare-ups of COPD. Bronchodilators help treat COPD by dilating the airways to allow increased airflow and make your breathing more comfortable. Steroids can reduce airway inflammation and help prevent exacerbations. Smoking cessation. If you smoke, your health care provider may ask you to quit, and may also recommend therapy or replacement products to help you quit. Pulmonary rehabilitation. This may involve working with a team of health care providers and specialists, such as respiratory, occupational, and physical therapists. Exercise and physical activity. These are beneficial for nearly all people with COPD. Nutrition therapy to gain weight, if you are underweight. Oxygen. Supplemental oxygen therapy is only helpful if you have a low oxygen level in your blood (hypoxemia). Lung surgery or transplant. Palliative care. This is to help people with COPD feel comfortable when treatment is no longer working. Follow these instructions at home: Medicines Take over-the-counter and prescription medicines only as told by your health care provider. This includes inhaled medicines and pills. Talk to your health care provider before taking any cough or allergy medicines. You may need to avoid certain medicines that dry out your airways. Lifestyle If you smoke, the most important thing that you can do is to stop smoking. Continuing to smoke will cause the disease to progress faster. Do not use any products that contain nicotine or tobacco. These products include cigarettes, chewing tobacco, and vaping devices, such as e-cigarettes. If you need help quitting, ask your health care provider. Avoid exposure to things that irritate your lungs, such as smoke, chemicals, and fumes. Stay active, but balance  activity with  periods of rest. Exercise and physical activity will help you maintain your ability to do things you want to do. Learn and use relaxation techniques to manage stress and to control your breathing. Get the right amount of sleep and get quality sleep. Most adults need 7 or more hours per night. Eat healthy foods. Eating smaller, more frequent meals and resting before meals may help you maintain your strength. Controlled breathing Learn and use controlled breathing techniques as directed by your health care provider. Controlled breathing techniques include: Pursed lip breathing. Start by breathing in (inhaling) through your nose for 1 second. Then, purse your lips as if you were going to whistle and breathe out (exhale) through the pursed lips for 2 seconds. Diaphragmatic breathing. Start by putting one hand on your abdomen just above your waist. Inhale slowly through your nose. The hand on your abdomen should move out. Then purse your lips and exhale slowly. You should be able to feel the hand on your abdomen moving in as you exhale.  Controlled coughing Learn and use controlled coughing to clear mucus from your lungs. Controlled coughing is a series of short, progressive coughs. The steps of controlled coughing are: Lean your head slightly forward. Breathe in deeply using diaphragmatic breathing. Try to hold your breath for 3 seconds. Keep your mouth slightly open while coughing twice. Spit any mucus out into a tissue. Rest and repeat the steps once or twice as needed. General instructions Make sure you receive all the vaccines that your health care provider recommends, especially the pneumococcal and influenza vaccines. Preventing infection and hospitalization is very important when you have COPD. Drink enough fluid to keep your urine pale yellow, unless you have a medical condition that requires fluid restriction. Use oxygen therapy and pulmonary rehabilitation if told by your  health care provider. If you require home oxygen therapy, ask your health care provider whether you should purchase a pulse oximeter to measure your oxygen level at home. Work with your health care provider to develop a COPD action plan. This will help you know what steps to take if your condition gets worse. Keep other chronic health conditions under control as told by your health care provider. Avoid extreme temperature and humidity changes. Avoid contact with people who have an illness that spreads from person to person (is contagious), such as viral infections or pneumonia. Keep all follow-up visits. This is important. Contact a health care provider if: You are coughing up more mucus than usual. There is a change in the color or thickness of your mucus. Your breathing is more labored than usual. Your breathing is faster than usual. You have difficulty sleeping. You need to use your rescue medicines or inhalers more often than expected. You have trouble doing routine activities such as getting dressed or walking around the house. Get help right away if: You have shortness of breath while you are resting. You have shortness of breath that prevents you from: Being able to talk. Performing your usual physical activities. You have chest pain lasting longer than 5 minutes. Your skin color is more blue (cyanotic) than usual. You measure low oxygen saturations for longer than 5 minutes with a pulse oximeter. You have a fever. You feel too tired to breathe normally. These symptoms may represent a serious problem that is an emergency. Do not wait to see if the symptoms will go away. Get medical help right away. Call your local emergency services (911 in the [place] Do not drive yourself to  the hospital. Summary Chronic obstructive pulmonary disease (COPD) is a long-term (chronic) condition that affects the lungs. Your lung function will probably never return to normal. In most cases, it gets  worse over time. However, there are steps you can take to slow the progression of the disease and improve your quality of life. Treatment for COPD may include taking medicines, quitting smoking, pulmonary rehabilitation, and changes to diet and exercise. As the disease progresses, you may need oxygen therapy, a lung transplant, or palliative care. To help manage your condition, do not smoke, avoid exposure to things that irritate your lungs, stay up to date on all vaccines, and follow your health care provider's instructions for taking medicines. This information is not intended to replace advice given to you by your health care provider. Make sure you discuss any questions you have with your health care provider. Document Revised: 04/06/2020 Document Reviewed: 04/06/2020 Elsevier Patient Education  2022 [employer].

## 2021-06-20 ENCOUNTER — Other Ambulatory Visit: Payer: Self-pay

## 2021-06-20 ENCOUNTER — Ambulatory Visit (INDEPENDENT_AMBULATORY_CARE_PROVIDER_SITE_OTHER): Payer: Medicare HMO | Admitting: Physician Assistant

## 2021-06-20 ENCOUNTER — Encounter: Payer: Self-pay | Admitting: Physician Assistant

## 2021-06-20 VITALS — BP 150/82 | HR 76 | Temp 97.9°F | Ht 72.0 in | Wt 167.0 lb

## 2021-06-20 DIAGNOSIS — I10 Essential (primary) hypertension: Secondary | ICD-10-CM | POA: Diagnosis not present

## 2021-06-20 DIAGNOSIS — R059 Cough, unspecified: Secondary | ICD-10-CM | POA: Diagnosis not present

## 2021-06-20 DIAGNOSIS — J441 Chronic obstructive pulmonary disease with (acute) exacerbation: Secondary | ICD-10-CM | POA: Diagnosis not present

## 2021-06-20 MED ORDER — HYDROCOD POLST-CPM POLST ER 10-8 MG/5ML PO SUER: 5.0000 mL | Freq: Two times a day (BID) | ORAL | 0 refills | Status: DC | PRN

## 2021-06-20 MED ORDER — LOSARTAN POTASSIUM 25 MG PO TABS
25.0000 mg | ORAL_TABLET | Freq: Every day | ORAL | 1 refills | Status: DC
Start: 2021-06-20 — End: 2021-07-12

## 2021-06-20 NOTE — Patient Instructions (Signed)
Finish doxycycline Finish prednisone Start cough syrup at bedtime Stop lisinopril start losartan for BP.  Follow up 2 weeks.

## 2021-06-20 NOTE — Progress Notes (Signed)
° °  Subjective:    Patient ID: Timothy Perkins, male    DOB: 1951/01/07, 71 y.o.   MRN: [id number]  HPI Pt is a 71 yo male who is a current smoker and here to follow up on COPD exacerbation. He is on prednisone, doxycycline, albuterol. He continues to take mucinex during the day. Cough is manageable during the day but at night he cannot sleep at all. If he gets hot at all he starts really coughing. No fever, chills, body aches.   .. Active Ambulatory Problems    Diagnosis Date Noted   Alcohol abuse 01/07/2014   Gastritis and duodenitis 01/08/2014   Anxiety state 09/18/2014   COPD exacerbation ([place]) 02/25/2016   Tobacco dependency 02/25/2016   COPD (chronic obstructive pulmonary disease) with chronic bronchitis ([place]) 03/08/2016   Essential hypertension, benign 06/16/2016   Gastroesophageal reflux disease with esophagitis 02/22/2018   Seasonal allergies 02/22/2018   Solar lentigo 04/17/2018   Actinic keratosis 04/17/2018   Melanoma of skin ([place]) 04/19/2018   Current smoker 04/23/2020   Hyperlipidemia 05/30/2021   Hyperlipidemia LDL goal <100 06/07/2021   Resolved Ambulatory Problems    Diagnosis Date Noted   Depression 01/07/2014   Past Medical History:  Diagnosis Date   COPD (chronic obstructive pulmonary disease) (HCC)    Hypertension    Lazy eye of left side      Review of Systems See HPI.     Objective:   Physical Exam Vitals reviewed.  Constitutional:      Appearance: Normal appearance.  HENT:     Head: Normocephalic.  Neck:     Vascular: No carotid bruit.  Cardiovascular:     Rate and Rhythm: Regular rhythm. Tachycardia present.     Pulses: Normal pulses.  Pulmonary:     Effort: Pulmonary effort is normal.     Comments: Coarse breath sounds with more crackles at bilateral bases.  Lymphadenopathy:     Cervical: No cervical adenopathy.  Neurological:     Mental Status: He is alert.  Psychiatric:        Mood and Affect: Mood normal.           Assessment & Plan:  Marland KitchenMarland KitchenOdus was seen today for cough.  Diagnoses and all orders for this visit:  COPD exacerbation ([place]) -     chlorpheniramine-HYDROcodone (TUSSIONEX PENNKINETIC ER) 10-8 MG/5ML SUER; Take 5 mLs by mouth every 12 (twelve) hours as needed for cough.  Cough, unspecified type -     chlorpheniramine-HYDROcodone (TUSSIONEX PENNKINETIC ER) 10-8 MG/5ML SUER; Take 5 mLs by mouth every 12 (twelve) hours as needed for cough.  Essential hypertension, benign -     losartan (COZAAR) 25 MG tablet; Take 1 tablet (25 mg total) by mouth daily.   ? If some of cough could be due to ACE.  Stop ACE and start losartan for BP Follow up in 2 weeks Added tussionex for bedtime cough Continue prednisone, doxycycline, albuterol Vitals look great today other than BP which could be due to all his coughing and prednisone

## 2021-07-04 ENCOUNTER — Ambulatory Visit (INDEPENDENT_AMBULATORY_CARE_PROVIDER_SITE_OTHER): Payer: Medicare HMO | Admitting: Physician Assistant

## 2021-07-04 ENCOUNTER — Other Ambulatory Visit: Payer: Self-pay

## 2021-07-04 ENCOUNTER — Encounter: Payer: Self-pay | Admitting: Physician Assistant

## 2021-07-04 VITALS — BP 124/60 | HR 77 | Ht 72.0 in | Wt 163.0 lb

## 2021-07-04 DIAGNOSIS — E785 Hyperlipidemia, unspecified: Secondary | ICD-10-CM

## 2021-07-04 DIAGNOSIS — J42 Unspecified chronic bronchitis: Secondary | ICD-10-CM

## 2021-07-04 DIAGNOSIS — R079 Chest pain, unspecified: Secondary | ICD-10-CM

## 2021-07-04 DIAGNOSIS — I10 Essential (primary) hypertension: Secondary | ICD-10-CM | POA: Diagnosis not present

## 2021-07-04 DIAGNOSIS — F17200 Nicotine dependence, unspecified, uncomplicated: Secondary | ICD-10-CM | POA: Diagnosis not present

## 2021-07-04 DIAGNOSIS — E1169 Type 2 diabetes mellitus with other specified complication: Secondary | ICD-10-CM

## 2021-07-04 DIAGNOSIS — R9431 Abnormal electrocardiogram [ECG] [EKG]: Secondary | ICD-10-CM | POA: Diagnosis not present

## 2021-07-04 DIAGNOSIS — Z122 Encounter for screening for malignant neoplasm of respiratory organs: Secondary | ICD-10-CM

## 2021-07-04 NOTE — Patient Instructions (Addendum)
ASA 81mg  daily Will get stress test.  Will get ultrasound to screen for abdominal aneurysms.  CT of lung for chronic cough/current smoker Continue on losaartan goal BP 130/80 or under Continue on Trelegy

## 2021-07-04 NOTE — Progress Notes (Signed)
Subjective:     Patient ID: Timothy Perkins, male   DOB: [birth year], 71 y.o.   MRN: [id number]  HPI 71 YO current male smoker presents to the clinic to follow up on a COPD exacerbation. He continues to cough but says it has improved. He has daily congestion but denies any sore throat, current chest pain or shortness of breath. He finished his prednisone and doxycycline. He takes 1 puff of his albuterol inhaler as needed which helps his breathing. He does report lack of energy and thinks this is either due to the losartan or being sick for so long.   Patient reports heartburn on Thursday and Friday. He also reports chest pain on Friday while he was sitting in his chair after eating pizza with peppers on it. Tums did not relief his symptoms. 2 baby aspirin did improve his symptoms after 20 minutes. He denies any current chest pain.   Patient voiced that he would not like an autopsy. Patient would like to defer dermatology referral. He denies any new skin lesions or concerns for his skin and would not like to go to the dermatologist at this time.    Review of Systems  Constitutional:  Positive for fatigue.  HENT:  Positive for congestion. Negative for sore throat.   Respiratory:  Positive for cough. Negative for shortness of breath and wheezing.   Cardiovascular:  Positive for chest pain. Negative for palpitations.  Gastrointestinal:  Negative for abdominal pain.  Skin: Negative.       Objective:   Physical Exam Constitutional:      General: He is not in acute distress.    Appearance: Normal appearance. He is normal weight. He is not ill-appearing, toxic-appearing or diaphoretic.  HENT:     Head: Normocephalic and atraumatic.     Mouth/Throat:     Mouth: Mucous membranes are moist.     Pharynx: No oropharyngeal exudate or posterior oropharyngeal erythema.  Eyes:     Conjunctiva/sclera: Conjunctivae normal.  Cardiovascular:     Rate and Rhythm: Normal rate and regular rhythm.     Heart  sounds: Normal heart sounds.  Pulmonary:     Effort: Pulmonary effort is normal.     Breath sounds: Normal breath sounds.  Abdominal:     General: Abdomen is flat. Bowel sounds are normal.     Palpations: Abdomen is soft.  Musculoskeletal:        General: Normal range of motion.  Skin:    General: Skin is warm and dry.  Neurological:     Mental Status: He is alert and oriented to person, place, and time.  Psychiatric:        Mood and Affect: Mood normal.        Behavior: Behavior normal.        Thought Content: Thought content normal.        Judgment: Judgment normal.   .. Today's Vitals   07/04/21 0826 07/04/21 0905  BP: (!) 141/77 124/60  Pulse: 77   SpO2: 99%   Weight: 163 lb (73.9 kg)   Height: 6' (1.829 m)    Body mass index is 22.11 kg/m. Second BP reading 124/60.     Assessment:  Marland KitchenMarland KitchenYao was seen today for follow-up.  Diagnoses and all orders for this visit:  Left-sided chest pain -     EKG 12-Lead -     Cardiac Stress Test: Informed Consent Details: Physician/Practitioner Attestation; Transcribe to consent form and obtain patient signature -  Exercise Tolerance Test; Future -     [place] [employer] SCREENING; Future  Essential hypertension, benign -     Cardiac Stress Test: Informed Consent Details: Physician/Practitioner Attestation; Transcribe to consent form and obtain patient signature -     Exercise Tolerance Test; Future -     [place] [employer] SCREENING; Future  Chronic bronchitis, unspecified chronic bronchitis type ([place]) -     Ambulatory Referral for Lung Cancer Scre  Hyperlipidemia associated with type 2 diabetes mellitus ([place]) -     Cardiac Stress Test: Informed Consent Details: Physician/Practitioner Attestation; Transcribe to consent form and obtain patient signature -     Exercise Tolerance Test; Future -     [place] [employer] SCREENING; Future  Current smoker -     Ambulatory Referral for Lung Cancer Scre -     Cardiac Stress Test:  Informed Consent Details: Physician/Practitioner Attestation; Transcribe to consent form and obtain patient signature -     Exercise Tolerance Test; Future -     [place] [employer] SCREENING; Future  Screening for lung cancer -     Ambulatory Referral for Lung Cancer Scre  Abnormal EKG -     Cardiac Stress Test: Informed Consent Details: Physician/Practitioner Attestation; Transcribe to consent form and obtain patient signature -     Exercise Tolerance Test; Future          Plan:   In office EKG shows nonspecific ST changes, will use this EKG as a baseline. Appears like some anterior lead ST elevation. Order stress test to follow up on suspected strain on the heart. Pt not symptomatic today. Start ASA 81mg . Order low dose CT to monitor lungs for any neoplasms due to patient age and smoker status. Order ultrasound on the abdomen to screen to AAA due to patient age and smoker status. Educated patient to return to the clinic or directly to the emergency room if he experiences chest pain again. Continue losartan and lipitor. 2nd recheck BP much better. Gave patient information on advanced directives to fill out and discuss at our next visit.   Information of advance directives given for patient to fill out and bring back.   Follow up if chest pain returns.

## 2021-07-05 ENCOUNTER — Encounter: Payer: Self-pay | Admitting: Physician Assistant

## 2021-07-05 NOTE — Telephone Encounter (Signed)
I see a DM diagnosis but don't see where we have ever done an A1C, is this a mistake?

## 2021-07-07 ENCOUNTER — Ambulatory Visit: Payer: Medicare HMO

## 2021-07-08 DIAGNOSIS — H5213 Myopia, bilateral: Secondary | ICD-10-CM | POA: Diagnosis not present

## 2021-07-08 DIAGNOSIS — H52223 Regular astigmatism, bilateral: Secondary | ICD-10-CM | POA: Diagnosis not present

## 2021-07-12 ENCOUNTER — Other Ambulatory Visit: Payer: Self-pay | Admitting: Physician Assistant

## 2021-07-12 DIAGNOSIS — I10 Essential (primary) hypertension: Secondary | ICD-10-CM

## 2021-07-13 ENCOUNTER — Ambulatory Visit (INDEPENDENT_AMBULATORY_CARE_PROVIDER_SITE_OTHER): Payer: Medicare HMO

## 2021-07-13 ENCOUNTER — Other Ambulatory Visit: Payer: Self-pay | Admitting: Physician Assistant

## 2021-07-13 ENCOUNTER — Inpatient Hospital Stay (HOSPITAL_COMMUNITY): Admission: RE | Admit: 2021-07-13 | Payer: Medicare HMO | Source: Ambulatory Visit

## 2021-07-13 ENCOUNTER — Encounter: Payer: Self-pay | Admitting: Physician Assistant

## 2021-07-13 ENCOUNTER — Other Ambulatory Visit: Payer: Self-pay | Admitting: Neurology

## 2021-07-13 ENCOUNTER — Other Ambulatory Visit: Payer: Self-pay

## 2021-07-13 DIAGNOSIS — E785 Hyperlipidemia, unspecified: Secondary | ICD-10-CM | POA: Diagnosis not present

## 2021-07-13 DIAGNOSIS — E1169 Type 2 diabetes mellitus with other specified complication: Secondary | ICD-10-CM | POA: Diagnosis not present

## 2021-07-13 DIAGNOSIS — R079 Chest pain, unspecified: Secondary | ICD-10-CM | POA: Diagnosis not present

## 2021-07-13 DIAGNOSIS — I10 Essential (primary) hypertension: Secondary | ICD-10-CM | POA: Diagnosis not present

## 2021-07-13 DIAGNOSIS — F17200 Nicotine dependence, unspecified, uncomplicated: Secondary | ICD-10-CM | POA: Diagnosis not present

## 2021-07-13 DIAGNOSIS — Z136 Encounter for screening for cardiovascular disorders: Secondary | ICD-10-CM | POA: Diagnosis not present

## 2021-07-13 DIAGNOSIS — I700 Atherosclerosis of aorta: Secondary | ICD-10-CM

## 2021-07-13 DIAGNOSIS — Z87891 Personal history of nicotine dependence: Secondary | ICD-10-CM | POA: Diagnosis not present

## 2021-07-13 NOTE — Progress Notes (Signed)
Mildly dilated upper aorta. Suggested thoracic [place] to evaluate. If patient ok with this ok to order. There is a lot of plaque accumulation. Please take your cholesterol medication every day. Repeat in 3 years.

## 2021-07-13 NOTE — Addendum Note (Signed)
Addended byAnnamaria Helling on: 07/13/2021 03:33 PM   Modules accepted: Orders

## 2021-07-13 NOTE — Progress Notes (Signed)
Done

## 2021-07-13 NOTE — Addendum Note (Signed)
Addended by: Donella Stade on: 07/13/2021 03:40 PM   Modules accepted: Orders

## 2021-07-18 ENCOUNTER — Other Ambulatory Visit: Payer: Self-pay

## 2021-07-18 ENCOUNTER — Ambulatory Visit (INDEPENDENT_AMBULATORY_CARE_PROVIDER_SITE_OTHER): Payer: Medicare HMO

## 2021-07-18 DIAGNOSIS — K760 Fatty (change of) liver, not elsewhere classified: Secondary | ICD-10-CM | POA: Diagnosis not present

## 2021-07-18 DIAGNOSIS — I700 Atherosclerosis of aorta: Secondary | ICD-10-CM | POA: Diagnosis not present

## 2021-07-18 DIAGNOSIS — J439 Emphysema, unspecified: Secondary | ICD-10-CM | POA: Diagnosis not present

## 2021-07-18 LAB — I-STAT CREATININE (MANUAL ENTRY): Creatinine, Ser: 0.8 (ref 0.50–1.10)

## 2021-07-18 MED ORDER — IOHEXOL 350 MG/ML SOLN
100.0000 mL | Freq: Once | INTRAVENOUS | Status: AC | PRN
  Administered 2021-07-18: 100 mL via INTRAVENOUS

## 2021-07-20 NOTE — Progress Notes (Signed)
This was ordered after screening for AAA showed possible thoracic aneurysm. Would you send him to vascular. He is on lipitor 40(just convinced him to start) trying to get him to stop smoking. I don't think they would do anything but maybe a good ide for them to keep eye on him soon.

## 2021-07-20 NOTE — Progress Notes (Signed)
No evidence of PE. You have lots of plaque putting you at risk for stroke, heart attacks, aneurysms and dissections. You need to stop smoking and start cholesterol medication for risk prevention.   You have emphysema.   You have a fatty liver.

## 2021-07-21 ENCOUNTER — Encounter: Payer: Self-pay | Admitting: Family Medicine

## 2021-07-21 DIAGNOSIS — K760 Fatty (change of) liver, not elsewhere classified: Secondary | ICD-10-CM | POA: Insufficient documentation

## 2021-07-25 ENCOUNTER — Ambulatory Visit (INDEPENDENT_AMBULATORY_CARE_PROVIDER_SITE_OTHER): Payer: Medicare HMO | Admitting: Physician Assistant

## 2021-07-25 ENCOUNTER — Other Ambulatory Visit: Payer: Self-pay

## 2021-07-25 ENCOUNTER — Ambulatory Visit (INDEPENDENT_AMBULATORY_CARE_PROVIDER_SITE_OTHER): Payer: Medicare HMO

## 2021-07-25 VITALS — BP 127/65 | HR 99 | Ht 72.0 in | Wt 167.0 lb

## 2021-07-25 DIAGNOSIS — J302 Other seasonal allergic rhinitis: Secondary | ICD-10-CM | POA: Diagnosis not present

## 2021-07-25 DIAGNOSIS — K219 Gastro-esophageal reflux disease without esophagitis: Secondary | ICD-10-CM | POA: Diagnosis not present

## 2021-07-25 DIAGNOSIS — M2578 Osteophyte, vertebrae: Secondary | ICD-10-CM | POA: Diagnosis not present

## 2021-07-25 DIAGNOSIS — M542 Cervicalgia: Secondary | ICD-10-CM

## 2021-07-25 MED ORDER — CETIRIZINE HCL 10 MG PO TABS: ORAL_TABLET | ORAL | 3 refills | Status: DC

## 2021-07-25 MED ORDER — FAMOTIDINE 20 MG PO TABS: 20.0000 mg | ORAL_TABLET | Freq: Two times a day (BID) | ORAL | 3 refills | Status: DC

## 2021-07-25 NOTE — Patient Instructions (Addendum)
Cut losaartan in half and check BP at home.  Tens unit Icy hot patches Consider chiropractor Consider massage Use voltaren gel up to 4 times a day  Neck Exercises Ask your health care provider which exercises are safe for you. Do exercises exactly as told by your health care provider and adjust them as directed. It is normal to feel mild stretching, pulling, tightness, or discomfort as you do these exercises. Stop right away if you feel sudden pain or your pain gets worse. Do not begin these exercises until told by your health care provider. Neck exercises can be important for many reasons. They can improve strength and maintain flexibility in your neck, which will help your upper back and prevent neck pain. Stretching exercises Rotation neck stretching  Sit in a chair or stand up. Place your feet flat on the floor, shoulder-width apart. Slowly turn your head (rotate) to the right until a slight stretch is felt. Turn it all the way to the right so you can look over your right shoulder. Do not tilt or tip your head. Hold this position for 10-30 seconds. Slowly turn your head (rotate) to the left until a slight stretch is felt. Turn it all the way to the left so you can look over your left shoulder. Do not tilt or tip your head. Hold this position for 10-30 seconds. Repeat __________ times. Complete this exercise __________ times a day. Neck retraction  Sit in a sturdy chair or stand up. Look straight ahead. Do not bend your neck. Use your fingers to push your chin backward (retraction). Do not bend your neck for this movement. Continue to face straight ahead. If you are doing the exercise properly, you will feel a slight sensation in your throat and a stretch at the back of your neck. Hold the stretch for 1-2 seconds. Repeat __________ times. Complete this exercise __________ times a day. Strengthening exercises Neck press  Lie on your back on a firm bed or on the floor with a pillow  under your head. Use your neck muscles to push your head down on the pillow and straighten your spine. Hold the position as well as you can. Keep your head facing up (in a neutral position) and your chin tucked. Slowly count to 5 while holding this position. Repeat __________ times. Complete this exercise __________ times a day. Isometrics These are exercises in which you strengthen the muscles in your neck while keeping your neck still (isometrics). Sit in a supportive chair and place your hand on your forehead. Keep your head and face facing straight ahead. Do not flex or extend your neck while doing isometrics. Push forward with your head and neck while pushing back with your hand. Hold for 10 seconds. Do the sequence again, this time putting your hand against the back of your head. Use your head and neck to push backward against the hand pressure. Finally, do the same exercise on either side of your head, pushing sideways against the pressure of your hand. Repeat __________ times. Complete this exercise __________ times a day. Prone head lifts  Lie face-down (prone position), resting on your elbows so that your chest and upper back are raised. Start with your head facing downward, near your chest. Position your chin either on or near your chest. Slowly lift your head upward. Lift until you are looking straight ahead. Then continue lifting your head as far back as you can comfortably stretch. Hold your head up for 5 seconds. Then slowly  lower it to your starting position. Repeat __________ times. Complete this exercise __________ times a day. Supine head lifts  Lie on your back (supine position), bending your knees to point to the ceiling and keeping your feet flat on the floor. Lift your head slowly off the floor, raising your chin toward your chest. Hold for 5 seconds. Repeat __________ times. Complete this exercise __________ times a day. Scapular retraction  Stand with your arms at  your sides. Look straight ahead. Slowly pull both shoulders (scapulae) backward and downward (retraction) until you feel a stretch between your shoulder blades in your upper back. Hold for 10-30 seconds. Relax and repeat. Repeat __________ times. Complete this exercise __________ times a day. Contact a health care provider if: Your neck pain or discomfort gets worse when you do an exercise. Your neck pain or discomfort does not improve within 2 hours after you exercise. If you have any of these problems, stop exercising right away. Do not do the exercises again unless your health care provider says that you can. Get help right away if: You develop sudden, severe neck pain. If this happens, stop exercising right away. Do not do the exercises again unless your health care provider says that you can. This information is not intended to replace advice given to you by your health care provider. Make sure you discuss any questions you have with your health care provider. Document Revised: 11/23/2020 Document Reviewed: 11/23/2020 Elsevier Patient Education  [employer].

## 2021-07-26 ENCOUNTER — Encounter: Payer: Self-pay | Admitting: Physician Assistant

## 2021-07-26 DIAGNOSIS — M542 Cervicalgia: Secondary | ICD-10-CM | POA: Insufficient documentation

## 2021-07-26 NOTE — Progress Notes (Signed)
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

## 2021-07-26 NOTE — Progress Notes (Signed)
° °  Subjective:    Patient ID: Timothy Perkins, male    DOB: [birth year], 71 y.o.   MRN: [id number]  HPI Pt is a 71 yo male who presents to the clinic with left sided neck pain and catching for the last 3 weeks. Not sleeping with cervical pillow. No new injury. He continues to have a very physical lifestyle. Denies any arm weakness. He has noticed some tingling in arms first thing in mornings but goes away fast. Not tried anything to make better.   .. Active Ambulatory Problems    Diagnosis Date Noted   Alcohol abuse 01/07/2014   Gastritis and duodenitis 01/08/2014   Anxiety state 09/18/2014   COPD exacerbation ([place]) 02/25/2016   Tobacco dependency 02/25/2016   Chronic bronchitis ([place]) 03/08/2016   Essential hypertension, benign 06/16/2016   Gastroesophageal reflux disease with esophagitis 02/22/2018   Seasonal allergies 02/22/2018   Solar lentigo 04/17/2018   Actinic keratosis 04/17/2018   Melanoma of skin ([place]) 04/19/2018   Current smoker 04/23/2020   Hyperlipidemia 05/30/2021   Hyperlipidemia LDL goal <100 06/07/2021   Left-sided chest pain 07/04/2021   Hyperlipidemia associated with type 2 diabetes mellitus ([place]) 07/04/2021   Abnormal EKG 07/04/2021   Abdominal aortic atherosclerosis ([place]) 07/13/2021   Fatty liver 07/21/2021   Neck pain on left side 07/26/2021   Resolved Ambulatory Problems    Diagnosis Date Noted   Depression 01/07/2014   Past Medical History:  Diagnosis Date   COPD (chronic obstructive pulmonary disease) ([place])    Hypertension    Lazy eye of left side       Review of Systems See HPI.     Objective:   Physical Exam Vitals reviewed.  Constitutional:      Appearance: Normal appearance.  HENT:     Head: Normocephalic.  Neck:     Comments: Cervical spine:  No tenderness to palpation over cervical spine Tight left sided paracervical muscles to palpation ROM decreased to the left due to pain in left side of neck NROM of bilateral  shoulders Negative spurlings.  Cardiovascular:     Rate and Rhythm: Normal rate.  Pulmonary:     Effort: Pulmonary effort is normal.  Neurological:     General: No focal deficit present.     Mental Status: He is alert and oriented to person, place, and time.          Assessment & Plan:  Marland KitchenMarland KitchenMeiko was seen today for neck pain.  Diagnoses and all orders for this visit:  Neck pain on left side -     DG Cervical Spine Complete; Future  Gastroesophageal reflux disease, unspecified whether esophagitis present -     famotidine (PEPCID) 20 MG tablet; Take 1 tablet (20 mg total) by mouth 2 (two) times daily.  Seasonal allergies -     cetirizine (ZYRTEC) 10 MG tablet; TAKE 1 TABLET EVERY DAY.   Suspect cervical DDD.  Will get xrays. No red flag warning signs Consider tens unit, icy hot patches, chiropractor, massage, and voltaren gel to use as needed up tot 4 times a day.  Start exercises given Pt declines muscle relaxer or gabapentin.   BP pretty low. Cut losaartan in half. Continue to check BP at home.

## 2021-07-29 ENCOUNTER — Telehealth (HOSPITAL_COMMUNITY): Payer: Self-pay | Admitting: *Deleted

## 2021-07-29 NOTE — Telephone Encounter (Signed)
Close encounter 

## 2021-08-01 ENCOUNTER — Other Ambulatory Visit: Payer: Self-pay | Admitting: *Deleted

## 2021-08-01 DIAGNOSIS — F17210 Nicotine dependence, cigarettes, uncomplicated: Secondary | ICD-10-CM

## 2021-08-01 DIAGNOSIS — Z87891 Personal history of nicotine dependence: Secondary | ICD-10-CM

## 2021-08-03 ENCOUNTER — Other Ambulatory Visit: Payer: Self-pay

## 2021-08-03 ENCOUNTER — Ambulatory Visit (HOSPITAL_COMMUNITY)
Admission: RE | Admit: 2021-08-03 | Discharge: 2021-08-03 | Disposition: A | Discharge status: Home / Self Care | Payer: Medicare HMO | Source: Ambulatory Visit | Attending: Cardiovascular Disease | Admitting: Cardiovascular Disease

## 2021-08-03 DIAGNOSIS — E1169 Type 2 diabetes mellitus with other specified complication: Secondary | ICD-10-CM | POA: Diagnosis not present

## 2021-08-03 DIAGNOSIS — R9431 Abnormal electrocardiogram [ECG] [EKG]: Secondary | ICD-10-CM | POA: Diagnosis not present

## 2021-08-03 DIAGNOSIS — R079 Chest pain, unspecified: Secondary | ICD-10-CM

## 2021-08-03 DIAGNOSIS — E785 Hyperlipidemia, unspecified: Secondary | ICD-10-CM

## 2021-08-03 DIAGNOSIS — I10 Essential (primary) hypertension: Secondary | ICD-10-CM | POA: Diagnosis not present

## 2021-08-03 DIAGNOSIS — F17200 Nicotine dependence, unspecified, uncomplicated: Secondary | ICD-10-CM

## 2021-08-03 LAB — EXERCISE TOLERANCE TEST
Angina Index: 0
Duke Treadmill Score: 5
Estimated workload: 7
Exercise duration (min): 5 min
Exercise duration (sec): 1 s
MPHR: 148 {beats}/min
Peak HR: 122 {beats}/min
Percent HR: 82 %
Rest HR: 96 {beats}/min
ST Depression (mm): 0 mm

## 2021-08-03 NOTE — Progress Notes (Signed)
Not able to complete stress test but had to stop due to leg pain which can mean there is some peripheral vascular disease. I think a cardiology visit for overall risk assessment is a good next step since stress test was not fully completed.

## 2021-08-05 ENCOUNTER — Other Ambulatory Visit: Payer: Self-pay | Admitting: Neurology

## 2021-08-05 DIAGNOSIS — R079 Chest pain, unspecified: Secondary | ICD-10-CM

## 2021-08-05 NOTE — Progress Notes (Signed)
Patient agreeable to Cardiology referral, placed.

## 2021-08-12 ENCOUNTER — Ambulatory Visit (INDEPENDENT_AMBULATORY_CARE_PROVIDER_SITE_OTHER): Payer: Medicare HMO | Admitting: Acute Care

## 2021-08-12 ENCOUNTER — Other Ambulatory Visit: Payer: Self-pay | Admitting: Neurology

## 2021-08-12 ENCOUNTER — Encounter: Payer: Self-pay | Admitting: Acute Care

## 2021-08-12 DIAGNOSIS — F17210 Nicotine dependence, cigarettes, uncomplicated: Secondary | ICD-10-CM

## 2021-08-12 DIAGNOSIS — I10 Essential (primary) hypertension: Secondary | ICD-10-CM

## 2021-08-12 MED ORDER — LOSARTAN POTASSIUM 25 MG PO TABS: ORAL_TABLET | ORAL | 1 refills | Status: DC

## 2021-08-12 NOTE — Progress Notes (Signed)
Virtual Visit via Telephone Note ? ?I connected with Timothy Perkins on 04/26/21 at  2:00 PM EST by telephone and verified that I am speaking with the correct person using two identifiers. ? ?Location: ?Patient: Home ?Provider: Working from home ?  ?I discussed the limitations, risks, security and privacy concerns of performing an evaluation and management service by telephone and the availability of in person appointments. I also discussed with the patient that there may be a patient responsible charge related to this service. The patient expressed understanding and agreed to proceed. ? ?Shared Decision Making Visit Lung Cancer Screening Program ?[phone] ? ? ?Eligibility: ?Age 71 y.o. ?Pack Years Smoking History Calculation 60 ?(# packs/per year x # years smoked) ?Recent History of coughing up blood  no ?Unexplained weight loss? no ?( >Than 15 pounds within the last 6 months ) ?Prior History Lung / other cancer no ?(Diagnosis within the last 5 years already requiring surveillance chest CT Scans). ?Smoking Status Current Smoker ?Former Smokers: Years since quit: NA ? Quit Date: NA ? ?Visit Components: ?Discussion included one or more decision making aids. yes ?Discussion included risk/benefits of screening. yes ?Discussion included potential follow up diagnostic testing for abnormal scans. yes ?Discussion included meaning and risk of over diagnosis. yes ?Discussion included meaning and risk of False Positives. yes ?Discussion included meaning of total radiation exposure. yes ? ?Counseling Included: ?Importance of adherence to annual lung cancer LDCT screening. yes ?Impact of comorbidities on ability to participate in the program. yes ?Ability and willingness to under diagnostic treatment. yes ? ?Smoking Cessation Counseling: ?Current Smokers:  ?Discussed importance of smoking cessation. yes ?Information about tobacco cessation classes and interventions provided to patient. yes ?Patient provided with "ticket" for  LDCT Scan. yes ?Symptomatic Patient. yes ? Counseling(Intermediate counseling: > three minutes) [id number] ?Diagnosis Code: Tobacco Use Z72.0 ?Asymptomatic Patient no ? Counseling NA ?Former Smokers:  ?Discussed the importance of maintaining cigarette abstinence. yes ?Diagnosis Code: Personal History of Nicotine Dependence. G83.662 ?Information about tobacco cessation classes and interventions provided to patient. Yes ?Patient provided with "ticket" for LDCT Scan. yes ?Written Order for Lung Cancer Screening with LDCT placed in Epic. Yes ?(CT Chest Lung Cancer Screening Low Dose W/O CM) [id number] ?Z12.2-Screening of respiratory organs ?Z87.891-Personal history of nicotine dependence ? ? ?I spent 25 minutes of face to face time with him discussing the risks and benefits of lung cancer screening. We viewed a power point together that explained in detail the above noted topics. We took the time to pause the power point at intervals to allow for questions to be asked and answered to ensure understanding. We discussed that he had taken the single most powerful action possible to decrease his risk of developing lung cancer when he quit smoking. I counseled him to remain smoke free, and to contact me if he ever had the desire to smoke again so that I can provide resources and tools to help support the effort to remain smoke free. We discussed the time and location of the scan, and that either  Doroteo Glassman RN or I will call with the results within  24-48 hours of receiving them. He has my card and contact information in the event he needs to speak with me, in addition to a copy of the power point we reviewed as a resource. He verbalized understanding of all of the above and had no further questions upon leaving the office.  ? ? ? ?I explained to the patient that there has been a  high incidence of coronary artery disease noted on these exams. I explained that this is a non-gated exam therefore degree or severity cannot be  determined. This patient is on statin therapy. I have asked the patient to follow-up with their PCP regarding any incidental finding of coronary artery disease and management with diet or medication as they feel is clinically indicated. The patient verbalized understanding of the above and had no further questions. ? ? ?I spent 3 minutes counseling on smoking cessation and the health risks of continued tobacco abuse  ? ? ?Mathhew Buysse D. Harris, NP-C ?[facility] ?Personal contact information can be found on Amion  ?08/12/2021, 11:37 AM ? ? ? ? ? ? ? ? ? ?

## 2021-08-12 NOTE — Progress Notes (Signed)
Patient states blood pressure has been better - 118/124-60's. 90 day supply sent to mail order per patient request.  ?

## 2021-08-12 NOTE — Patient Instructions (Signed)
Thank you for participating in the [facility] Screening [facility]. °It was our pleasure to meet you today. °We will call you with the results of your scan within the next few days. °Your scan will be assigned a Lung RADS category score by the physicians reading the scans.  °This Lung RADS score determines follow up scanning.  °See below for description of categories, and follow up screening recommendations. °We will be in touch to schedule your follow up screening annually or based on recommendations of our providers. °We will fax a copy of your scan results to your Primary Care Physician, or the physician who referred you to the program, to ensure they have the results. °Please call the office if you have any questions or concerns regarding your scanning experience or results.  °Our office number is [phone]. °Please speak with Denise Phelps, RN. She is our Lung Cancer Screening RN. °If she is unavailable when you call, please have the office staff send her a message. She will return your call at her earliest convenience. °Remember, if your scan is normal, we will scan you annually as long as you continue to meet the criteria for the program. (Age 55-77, Current smoker or smoker who has quit within the last 15 years). °If you are a smoker, remember, quitting is the single most powerful action that you can take to decrease your risk of lung cancer and other pulmonary, breathing related problems. °We know quitting is hard, and we are here to help.  °Please let us know if there is anything we can do to help you meet your goal of quitting. °If you are a former smoker, congratulations. We are proud of you! Remain smoke free! °Remember you can refer friends or family members through the number above.  °We will screen them to make sure they meet criteria for the program. °Thank you for helping us take better care of you by participating in Lung Screening. ° °You can receive free nicotine replacement therapy  ( patches, gum or mints) by calling 1-800-QUIT NOW. Please call so we can get you on the path to becoming  a non-smoker. I know it is hard, but you can do this! ° °Lung RADS Categories: ° °Lung RADS 1: no nodules or definitely non-concerning nodules.  °Recommendation is for a repeat annual scan in 12 months. ° °Lung RADS 2:  nodules that are non-concerning in appearance and behavior with a very low likelihood of becoming an active cancer. °Recommendation is for a repeat annual scan in 12 months. ° °Lung RADS 3: nodules that are probably non-concerning , includes nodules with a low likelihood of becoming an active cancer.  Recommendation is for a 6-month repeat screening scan. Often noted after an upper respiratory illness. We will be in touch to make sure you have no questions, and to schedule your 6-month scan. ° °Lung RADS 4 A: nodules with concerning findings, recommendation is most often for a follow up scan in 3 months or additional testing based on our provider's assessment of the scan. We will be in touch to make sure you have no questions and to schedule the recommended 3 month follow up scan. ° °Lung RADS 4 B:  indicates findings that are concerning. We will be in touch with you to schedule additional diagnostic testing based on our provider's  assessment of the scan. ° °Hypnosis for smoking cessation  °[employer] °[phone] ° °Acupuncture for smoking cessation  °[facility] °[phone]  °

## 2021-08-15 ENCOUNTER — Other Ambulatory Visit: Payer: Self-pay

## 2021-08-15 ENCOUNTER — Ambulatory Visit (INDEPENDENT_AMBULATORY_CARE_PROVIDER_SITE_OTHER): Payer: Medicare HMO

## 2021-08-15 DIAGNOSIS — Z87891 Personal history of nicotine dependence: Secondary | ICD-10-CM

## 2021-08-15 DIAGNOSIS — Z122 Encounter for screening for malignant neoplasm of respiratory organs: Secondary | ICD-10-CM

## 2021-08-15 DIAGNOSIS — F17210 Nicotine dependence, cigarettes, uncomplicated: Secondary | ICD-10-CM | POA: Diagnosis not present

## 2021-08-16 ENCOUNTER — Other Ambulatory Visit: Payer: Self-pay | Admitting: Acute Care

## 2021-08-16 DIAGNOSIS — Z87891 Personal history of nicotine dependence: Secondary | ICD-10-CM

## 2021-08-16 DIAGNOSIS — F17210 Nicotine dependence, cigarettes, uncomplicated: Secondary | ICD-10-CM

## 2021-08-20 ENCOUNTER — Encounter: Payer: Self-pay | Admitting: Family Medicine

## 2021-08-20 DIAGNOSIS — J439 Emphysema, unspecified: Secondary | ICD-10-CM | POA: Insufficient documentation

## 2021-09-14 NOTE — Progress Notes (Deleted)
Referring-Timothy Breeback PA-C Reason for referral-chest pain  HPI: 71 year old male for evaluation of chest pain at request of Iran Planas PA-C.  Abdominal ultrasound February 2023 showed mildly dilated to top normal aorta at 3 cm.  Follow-up recommended in 3 years.  Exercise treadmill August 03, 2021 showed no ST changes but patient achieved 82% predicted maximal heart rate.  Blood pressure response was normal with total duration of exercise 5 minutes and 1 second.  Chest CT February 2023 showed no pulmonary embolus, coronary calcifications, atherosclerosis noted in thoracic aorta and emphysema.  There was also note of hypervascular structure in the right lobe of the liver and follow-up could be considered.  Current Outpatient Medications  Medication Sig Dispense Refill   albuterol (PROVENTIL HFA;VENTOLIN HFA) 108 (90 Base) MCG/ACT inhaler Inhale 2 puffs into the lungs every 6 (six) hours as needed for wheezing or shortness of breath. 3 Inhaler 1   atorvastatin (LIPITOR) 40 MG tablet Take 1 tablet (40 mg total) by mouth daily. 90 tablet 3   cetirizine (ZYRTEC) 10 MG tablet TAKE 1 TABLET EVERY DAY. 90 tablet 3   famotidine (PEPCID) 20 MG tablet Take 1 tablet (20 mg total) by mouth 2 (two) times daily. 180 tablet 3   Fluticasone-Umeclidin-Vilant (TRELEGY ELLIPTA) 100-62.5-25 MCG/ACT AEPB Inhale 1 puff into the lungs daily. 60 each 3   losartan (COZAAR) 25 MG tablet TAKE 1 TABLET (25 MG TOTAL) BY MOUTH DAILY.*D/C LISINOPRIL** 90 tablet 1   No current facility-administered medications for this visit.    Allergies  Allergen Reactions   Lisinopril     "loopy"     Past Medical History:  Diagnosis Date   COPD (chronic obstructive pulmonary disease) ([facility])    Hypertension    Lazy eye of left side     Past Surgical History:  Procedure Laterality Date   EYE SURGERY Left     Social History   Socioeconomic History   Marital status: Soil scientist    Spouse name: Not on file    Number of children: 2   Years of education: 11   Highest education level: 11th grade  Occupational History   Occupation: Games developer    Comment: retired  Tobacco Use   Smoking status: Every Day    Packs/day: 1.00    Years: 60.00    Pack years: 60.00    Types: Cigarettes   Smokeless tobacco: Never  Vaping Use   Vaping Use: Never used  Substance and Sexual Activity   Alcohol use: Yes    Alcohol/week: 5.0 standard drinks    Types: 5 Cans of beer per week   Drug use: No   Sexual activity: Yes  Other Topics Concern   Not on file  Social History Narrative   Takes care of farm and animals. Likes to ride motorcycle.    Social Determinants of Health   Financial Resource Strain: Not on file  Food Insecurity: Not on file  Transportation Needs: Not on file  Physical Activity: Not on file  Stress: Not on file  Social Connections: Not on file  Intimate Partner Violence: Not on file    Family History  Problem Relation Age of Onset   Cancer Father        Lung/smoker   Kidney disease Sister     ROS: no fevers or chills, productive cough, hemoptysis, dysphasia, odynophagia, melena, hematochezia, dysuria, hematuria, rash, seizure activity, orthopnea, PND, pedal edema, claudication. Remaining systems are negative.  Physical Exam:   There were  no vitals taken for this visit.  General:  Well developed/well nourished in NAD Skin warm/dry Patient not depressed No peripheral clubbing Back-normal HEENT-normal/normal eyelids Neck supple/normal carotid upstroke bilaterally; no bruits; no JVD; no thyromegaly chest - CTA/ normal expansion CV - RRR/normal S1 and S2; no murmurs, rubs or gallops;  PMI nondisplaced Abdomen -NT/ND, no HSM, no mass, + bowel sounds, no bruit 2+ femoral pulses, no bruits Ext-no edema, chords, 2+ DP Neuro-grossly nonfocal  ECG -July 04, 2021-sinus rhythm, first-degree AV block, nonspecific ST changes.  Personally reviewed  A/P  1 coronary artery  disease-based on coronary calcification noted on CT scan.  2 hypertension-patient's blood pressure is controlled.  Continue present medications.  3 hyperlipidemia-laboratories from December 2022 showed LDL 136.  4 minimally dilated abdominal aorta-plan follow-up ultrasound February 2026.  Kirk Ruths, MD

## 2021-09-28 ENCOUNTER — Ambulatory Visit: Payer: Medicare HMO | Admitting: Cardiology

## 2021-10-03 ENCOUNTER — Ambulatory Visit (INDEPENDENT_AMBULATORY_CARE_PROVIDER_SITE_OTHER): Payer: Medicare HMO | Admitting: Physician Assistant

## 2021-10-03 VITALS — BP 152/74 | HR 90 | Ht 72.0 in | Wt 169.0 lb

## 2021-10-03 DIAGNOSIS — F17200 Nicotine dependence, unspecified, uncomplicated: Secondary | ICD-10-CM

## 2021-10-03 DIAGNOSIS — K219 Gastro-esophageal reflux disease without esophagitis: Secondary | ICD-10-CM | POA: Diagnosis not present

## 2021-10-03 DIAGNOSIS — M5030 Other cervical disc degeneration, unspecified cervical region: Secondary | ICD-10-CM | POA: Diagnosis not present

## 2021-10-03 DIAGNOSIS — E785 Hyperlipidemia, unspecified: Secondary | ICD-10-CM | POA: Diagnosis not present

## 2021-10-03 DIAGNOSIS — I10 Essential (primary) hypertension: Secondary | ICD-10-CM | POA: Diagnosis not present

## 2021-10-03 DIAGNOSIS — I700 Atherosclerosis of aorta: Secondary | ICD-10-CM

## 2021-10-03 DIAGNOSIS — E119 Type 2 diabetes mellitus without complications: Secondary | ICD-10-CM | POA: Diagnosis not present

## 2021-10-03 DIAGNOSIS — J42 Unspecified chronic bronchitis: Secondary | ICD-10-CM

## 2021-10-03 NOTE — Progress Notes (Signed)
? ?Established Patient Office Visit ? ?Subjective   ?Patient ID: Timothy Perkins, male    DOB: 1951-05-31  Age: 71 y.o. MRN: [id number] ? ?Chief Complaint  ?Patient presents with  ? Follow-up  ? ? ?HPI ?Pt is a 71 yo male with HTN, Aortic Atherosclerosis, HLD, COPD, GERD, cervical DDD who presents to the clinic to discuss medications.  ? ?He has stopped all medications and he feels better. He is using his rescue inhaler 2-3 times a month right now. He does get SOB but overall feels better. No dizziness, headaches, CP, reflux.  ? ?He does have ongoing neck pain. Denies any upper ext weakness or radiation of pain/numbness or tingling.  ? ?1. Diffuse multilevel degenerative change. Degenerative changes most ?prominent at C5-C6 and C6-C7. Multilevel bilateral mild to moderate ?bilateral neural foraminal narrowing. No acute bony abnormality ?identified. ? ?.. ?Active Ambulatory Problems  ?  Diagnosis Date Noted  ? Alcohol abuse 01/07/2014  ? Gastritis and duodenitis 01/08/2014  ? Anxiety state 09/18/2014  ? COPD exacerbation ([place]) 02/25/2016  ? Tobacco dependency 02/25/2016  ? Chronic bronchitis ([place]) 03/08/2016  ? Essential hypertension, benign 06/16/2016  ? Gastroesophageal reflux disease with esophagitis 02/22/2018  ? Seasonal allergies 02/22/2018  ? Solar lentigo 04/17/2018  ? Actinic keratosis 04/17/2018  ? Melanoma of skin ([place]) 04/19/2018  ? Current smoker 04/23/2020  ? Hyperlipidemia 05/30/2021  ? Hyperlipidemia LDL goal <100 06/07/2021  ? Left-sided chest pain 07/04/2021  ? Hyperlipidemia associated with type 2 diabetes mellitus ([place]) 07/04/2021  ? Abnormal EKG 07/04/2021  ? Abdominal aortic atherosclerosis ([place]) 07/13/2021  ? Fatty liver 07/21/2021  ? Neck pain on left side 07/26/2021  ? Emphysema lung ([place]) 08/20/2021  ? Aortic atherosclerosis ([place]) 10/03/2021  ? DDD (degenerative disc disease), cervical 10/03/2021  ? Gastroesophageal reflux disease 10/04/2021  ? ?Resolved Ambulatory Problems  ?  Diagnosis Date  Noted  ? Depression 01/07/2014  ? ?Past Medical History:  ?Diagnosis Date  ? COPD (chronic obstructive pulmonary disease) ([place])   ? Hypertension   ? Lazy eye of left side   ? ? ? ? ?ROS ?See HPI.  ?  ?Objective:  ?  ? ?BP (!) 152/74   Pulse 90   Ht 6' (1.829 m)   Wt 169 lb (76.7 kg)   SpO2 99%   BMI 22.92 kg/m?  ?BP Readings from Last 3 Encounters:  ?10/03/21 (!) 152/74  ?07/25/21 127/65  ?07/04/21 124/60  ? ?  ? ?Physical Exam ?Vitals reviewed.  ?Constitutional:   ?   Appearance: Normal appearance.  ?HENT:  ?   Head: Normocephalic.  ?Neck:  ?   Vascular: No carotid bruit.  ?Cardiovascular:  ?   Rate and Rhythm: Normal rate and regular rhythm.  ?   Pulses: Normal pulses.  ?Pulmonary:  ?   Effort: Pulmonary effort is normal.  ?   Breath sounds: Normal breath sounds.  ?Musculoskeletal:  ?   Right lower leg: No edema.  ?   Left lower leg: No edema.  ?Lymphadenopathy:  ?   Cervical: No cervical adenopathy.  ?Neurological:  ?   General: No focal deficit present.  ?   Mental Status: He is alert and oriented to person, place, and time.  ?Psychiatric:     ?   Mood and Affect: Mood normal.  ? ? ? ?Last metabolic panel ?Lab Results  ?Component Value Date  ? GLUCOSE 96 05/25/2021  ? NA 138 05/25/2021  ? K 5.2 05/25/2021  ? CL 103 05/25/2021  ?  CO2 29 05/25/2021  ? BUN 10 05/25/2021  ? CREATININE 0.80 07/18/2021  ? EGFR 94 05/25/2021  ? CALCIUM 9.7 05/25/2021  ? PROT 6.6 05/25/2021  ? BILITOT 0.8 05/25/2021  ? AST 20 05/25/2021  ? ALT 23 05/25/2021  ? ?Last lipids ?Lab Results  ?Component Value Date  ? CHOL 216 (H) 05/25/2021  ? HDL 52 05/25/2021  ? LDLCALC 136 (H) 05/25/2021  ? TRIG 153 (H) 05/25/2021  ? CHOLHDL 4.2 05/25/2021  ? ?  ? ?The 10-year ASCVD risk score (Arnett DK, et al., 2019) is: 52.9% ? ?  ?Assessment & Plan:  ? ?Problem List Items Addressed This Visit   ? ?  ? Unprioritized  ? Chronic bronchitis ([place])  ? Essential hypertension, benign - Primary  ? Current smoker  ? Hyperlipidemia LDL goal <100  ? Abdominal  aortic atherosclerosis ([place])  ? Aortic atherosclerosis ([place])  ? DDD (degenerative disc disease), cervical  ? Gastroesophageal reflux disease  ? ?Took all medications off list.  ?Discussed his 10 year CV risk of over 50 percent.  ?Strongly urged him to take ASA 46m and lipitor daily.  ?Pt aware of need for BP control due to thoracic anersym. ?Pt aware of plaque build up that has been seen and need for statin ?Discussed there is more treatment for neck pain and DDD. ?Pt declines statin, BP medications, PT, muscle relaxer.  ?Pt is aware of risk.  ?Pt is aware going off trelegy will make breathing worse. ?Declined smoking cessation.  ? ? ?Return in about 6 months (around 04/04/2022).  ? ?Spent 30 minutes with patient reviewing chart, discussing recent imaging and risk of CV events, going over medication and risk vs benefit.  ? ? ?JIran Planas PA-C ? ?

## 2021-10-03 NOTE — Patient Instructions (Addendum)
Tylenol for neck pain ? ? ?

## 2021-10-04 ENCOUNTER — Encounter: Payer: Self-pay | Admitting: Physician Assistant

## 2021-10-04 DIAGNOSIS — K219 Gastro-esophageal reflux disease without esophagitis: Secondary | ICD-10-CM | POA: Insufficient documentation

## 2021-10-06 ENCOUNTER — Encounter: Payer: Self-pay | Admitting: Physician Assistant

## 2021-10-07 MED ORDER — TRELEGY ELLIPTA 100-62.5-25 MCG/ACT IN AEPB: 1.0000 | INHALATION_SPRAY | Freq: Every day | RESPIRATORY_TRACT | 3 refills | Status: DC

## 2021-10-07 NOTE — Telephone Encounter (Signed)
Ok to reorder trelegy and give a sample to get started.

## 2021-10-07 NOTE — Telephone Encounter (Signed)
Trelegy ordered, patient came by and picked up a sample.  ?

## 2021-10-11 ENCOUNTER — Ambulatory Visit (INDEPENDENT_AMBULATORY_CARE_PROVIDER_SITE_OTHER): Payer: Medicare HMO | Admitting: Physician Assistant

## 2021-10-11 DIAGNOSIS — Z0000 Encounter for general adult medical examination without abnormal findings: Secondary | ICD-10-CM | POA: Diagnosis not present

## 2021-10-11 NOTE — Patient Instructions (Addendum)
?MEDICARE ANNUAL WELLNESS VISIT ?Health Maintenance Summary and Written Plan of Care ? ?[name] , ? ?Thank you for allowing me to perform your Medicare Annual Wellness Visit and for your ongoing commitment to your health.  ? ?Health Maintenance & Immunization History ?Health Maintenance  ?Topic Date Due  ? OPHTHALMOLOGY EXAM  10/11/2021 (Originally 09/24/1960)  ? FOOT EXAM  10/12/2021 (Originally 09/24/1960)  ? HEMOGLOBIN A1C  10/13/2021 (Originally 28-Apr-1951)  ? COVID-19 Vaccine (3 - Moderna risk series) 10/27/2021 (Originally 04/28/2021)  ? URINE MICROALBUMIN  11/11/2021 (Originally 09/24/1960)  ? Zoster Vaccines- Shingrix (2 of 2) 01/11/2022 (Originally 08/07/2021)  ? TETANUS/TDAP  05/25/2022 (Originally [birth year]/1967)  ? INFLUENZA VACCINE  01/10/2022  ? Pneumonia Vaccine 19+ Years old  Completed  ? Hepatitis C Screening  Completed  ? HPV VACCINES  Aged Out  ? Fecal DNA (Cologuard)  Discontinued  ? ?Immunization History  ?Administered Date(s) Administered  ? Fluad Quad(high Dose 65+) 04/21/2020  ? Influenza, High Dose Seasonal PF 04/15/2018, 04/30/2019  ? Influenza,inj,Quad PF,6+ Mos 06/16/2016  ? Influenza-Unspecified 03/31/2021  ? Moderna Sars-Covid-2 Vaccination 06/25/2020, 03/31/2021  ? Pneumococcal Conjugate-13 06/16/2016  ? Pneumococcal Polysaccharide-23 04/15/2018  ? Zoster Recombinat (Shingrix) 06/12/2021  ? ? ?These are the patient goals that we discussed: ? Goals Addressed   ? ?  ?  ?  ?  ?  ? This Visit's Progress  ?   Patient Stated (pt-stated)     ?   Continue to take his medication. ?  ? ?  ?  ? ?This is a list of Health Maintenance Items that are overdue or due now: ?Td vaccine ?Shingrix vaccine 2nd dose  ? ?Orders/Referrals Placed Today: ?No orders of the defined types were placed in this encounter. ? ?(Contact our referral department at [phone] if you have not spoken with someone about your referral appointment within the next 5 days)  ? ? ?Follow-up Plan ?Follow-up with Donella Stade, PA-C as  planned ?Schedule your second shingles vaccine at your pharmacy.  ?Please bring your immunization record regarding your tetanus shot to the office.  ?Medicare wellness visit in one year. ?Patient will access AVS on my chart. ? ? ? ?  ?Health Maintenance, Male ?Adopting a healthy lifestyle and getting preventive care are important in promoting health and wellness. Ask your health care provider about: ?The right schedule for you to have regular tests and exams. ?Things you can do on your own to prevent diseases and keep yourself healthy. ?What should I know about diet, weight, and exercise? ?Eat a healthy diet ? ?Eat a diet that includes plenty of vegetables, fruits, low-fat dairy products, and lean protein. ?Do not eat a lot of foods that are high in solid fats, added sugars, or sodium. ?Maintain a healthy weight ?Body mass index (BMI) is a measurement that can be used to identify possible weight problems. It estimates body fat based on height and weight. Your health care provider can help determine your BMI and help you achieve or maintain a healthy weight. ?Get regular exercise ?Get regular exercise. This is one of the most important things you can do for your health. Most adults should: ?Exercise for at least 150 minutes each week. The exercise should increase your heart rate and make you sweat (moderate-intensity exercise). ?Do strengthening exercises at least twice a week. This is in addition to the moderate-intensity exercise. ?Spend less time sitting. Even light physical activity can be beneficial. ?Watch cholesterol and blood lipids ?Have your blood tested for lipids  and cholesterol at 73 years of age, then have this test every 5 years. ?You may need to have your cholesterol levels checked more often if: ?Your lipid or cholesterol levels are high. ?You are older than 73 years of age. ?You are at high risk for heart disease. ?What should I know about cancer screening? ?Many types of cancers can be detected  early and may often be prevented. Depending on your health history and family history, you may need to have cancer screening at various ages. This may include screening for: ?Colorectal cancer. ?Prostate cancer. ?Skin cancer. ?Lung cancer. ?What should I know about heart disease, diabetes, and high blood pressure? ?Blood pressure and heart disease ?High blood pressure causes heart disease and increases the risk of stroke. This is more likely to develop in people who have high blood pressure readings or are overweight. ?Talk with your health care provider about your target blood pressure readings. ?Have your blood pressure checked: ?Every 3-5 years if you are 9-2 years of age. ?Every year if you are 4 years old or older. ?If you are between the ages of 37 and 32 and are a current or former smoker, ask your health care provider if you should have a one-time screening for abdominal aortic aneurysm (AAA). ?Diabetes ?Have regular diabetes screenings. This checks your fasting blood sugar level. Have the screening done: ?Once every three years after age 20 if you are at a normal weight and have a low risk for diabetes. ?More often and at a younger age if you are overweight or have a high risk for diabetes. ?What should I know about preventing infection? ?Hepatitis B ?If you have a higher risk for hepatitis B, you should be screened for this virus. Talk with your health care provider to find out if you are at risk for hepatitis B infection. ?Hepatitis C ?Blood testing is recommended for: ?Everyone born from [birth year] through 1967. ?Anyone with known risk factors for hepatitis C. ?Sexually transmitted infections (STIs) ?You should be screened each year for STIs, including gonorrhea and chlamydia, if: ?You are sexually active and are younger than 73 years of age. ?You are older than 73 years of age and your health care provider tells you that you are at risk for this type of infection. ?Your sexual activity has changed since  you were last screened, and you are at increased risk for chlamydia or gonorrhea. Ask your health care provider if you are at risk. ?Ask your health care provider about whether you are at high risk for HIV. Your health care provider may recommend a prescription medicine to help prevent HIV infection. If you choose to take medicine to prevent HIV, you should first get tested for HIV. You should then be tested every 3 months for as long as you are taking the medicine. ?Follow these instructions at home: ?Alcohol use ?Do not drink alcohol if your health care provider tells you not to drink. ?If you drink alcohol: ?Limit how much you have to 0-2 drinks a day. ?Know how much alcohol is in your drink. In the [place] one drink equals one 12 oz bottle of beer (355 mL), one 5 oz glass of wine (148 mL), or one 1? oz glass of hard liquor (44 mL). ?Lifestyle ?Do not use any products that contain nicotine or tobacco. These products include cigarettes, chewing tobacco, and vaping devices, such as e-cigarettes. If you need help quitting, ask your health care provider. ?Do not use street drugs. ?Do not share needles. ?  Ask your health care provider for help if you need support or information about quitting drugs. ?General instructions ?Schedule regular health, dental, and eye exams. ?Stay current with your vaccines. ?Tell your health care provider if: ?You often feel depressed. ?You have ever been abused or do not feel safe at home. ?Summary ?Adopting a healthy lifestyle and getting preventive care are important in promoting health and wellness. ?Follow your health care provider's instructions about healthy diet, exercising, and getting tested or screened for diseases. ?Follow your health care provider's instructions on monitoring your cholesterol and blood pressure. ?This information is not intended to replace advice given to you by your health care provider. Make sure you discuss any questions you have with your health care  provider. ?Document Revised: 10/18/2020 Document Reviewed: 10/18/2020 ?Elsevier Patient Education ? [place]. ? ?

## 2021-10-11 NOTE — Progress Notes (Signed)
? ? ?MEDICARE ANNUAL WELLNESS VISIT ? ?10/11/2021 ? ?Telephone Visit Disclaimer ?This Medicare AWV was conducted by telephone due to national recommendations for restrictions regarding the COVID-19 Pandemic (e.g. social distancing).  I verified, using two identifiers, that I am speaking with Timothy Perkins or their authorized healthcare agent. I discussed the limitations, risks, security, and privacy concerns of performing an evaluation and management service by telephone and the potential availability of an in-person appointment in the future. The patient expressed understanding and agreed to proceed.  ?Location of Patient: Home ?Location of Provider (nurse):  In the office. ? ?Subjective:  ? ? ?Timothy Perkins is a 71 y.o. male patient of Donella Stade, PA-C who had a Medicare Annual Wellness Visit today via telephone. Garnell is Retired and lives with their partner. he has 2 children. he reports that he is socially active and does interact with friends/family regularly. 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 ? ?Patient Care Team: ?Lavada Mesi as PCP - General (Family Medicine) ?Darius Bump, [facility] as Pharmacist (Pharmacist) ? ? ?  10/11/2021  ?  4:06 PM 07/14/2020  ?  9:42 AM 02/05/2019  ?  8:07 AM  ?Advanced Directives  ?Does Patient Have a Medical Advance Directive? Yes Yes Yes  ?Type of Advance Directive Living will Living will [place];Living will  ?Does patient want to make changes to medical advance directive? No - Patient declined No - Patient declined No - Patient declined  ?Copy of [place] in Chart?   No - copy requested  ? ? ?Hospital Utilization Over the Past 12 Months: ?# of hospitalizations or ER visits: 0 ?# of surgeries: 0 ? ?Review of Systems    ?Patient reports that his overall health is better compared to last year. ? ?History obtained from chart review and the patient ? ?Patient Reported Readings  (BP, Pulse, CBG, Weight, etc) ?none ? ?Pain Assessment ?Pain : No/denies pain ? ?  ? ?Current Medications & Allergies (verified) ?Allergies as of 10/11/2021   ? ?   Reactions  ? Lisinopril   ? "loopy"  ? ?  ? ?  ?Medication List  ?  ? ?  ? Accurate as of Oct 11, 2021  4:21 PM. If you have any questions, ask your nurse or doctor.  ?  ?  ? ?  ? ?albuterol 108 (90 Base) MCG/ACT inhaler ?Commonly known as: VENTOLIN HFA ?Inhale 2 puffs into the lungs every 6 (six) hours as needed for wheezing or shortness of breath. ?  ?atorvastatin 40 MG tablet ?Commonly known as: LIPITOR ?Take 40 mg by mouth daily. ?  ?cetirizine 10 MG tablet ?Commonly known as: ZYRTEC ?TAKE 1 TABLET EVERY DAY. ?  ?famotidine 20 MG tablet ?Commonly known as: PEPCID ?Take 20 mg by mouth 2 (two) times daily. ?  ?losartan 25 MG tablet ?Commonly known as: COZAAR ?Take 25 mg by mouth daily. ?  ?Trelegy Ellipta 100-62.5-25 MCG/ACT Aepb ?Generic drug: Fluticasone-Umeclidin-Vilant ?Inhale 1 puff into the lungs daily. ?  ? ?  ? ? ?History (reviewed): ?Past Medical History:  ?Diagnosis Date  ? COPD (chronic obstructive pulmonary disease) ([place])   ? Hypertension   ? Lazy eye of left side   ? ?Past Surgical History:  ?Procedure Laterality Date  ? EYE SURGERY Left   ? ?Family History  ?Problem Relation Age of Onset  ? Cancer Father   ?  Lung/smoker  ? Kidney disease Sister   ? ?Social History  ? ?Socioeconomic History  ? Marital status: Soil scientist  ?  Spouse name: Not on file  ? Number of children: 2  ? Years of education: 86  ? Highest education level: 12th grade  ?Occupational History  ? Occupation: Games developer  ?  Comment: retired  ?Tobacco Use  ? Smoking status: Every Day  ?  Packs/day: 1.00  ?  Years: 60.00  ?  Pack years: 60.00  ?  Types: Cigarettes  ? Smokeless tobacco: Never  ?Vaping Use  ? Vaping Use: Never used  ?Substance and Sexual Activity  ? Alcohol use: Yes  ?  Alcohol/week: 35.0 standard drinks  ?  Types: 35 Cans of beer per week  ? Drug use: No   ? Sexual activity: Yes  ?Other Topics Concern  ? Not on file  ?Social History Narrative  ? Lives with his girlfriend. Takes care of farm and animals. Likes to ride motorcycle.   ? ?Social Determinants of Health  ? ?Financial Resource Strain: Low Risk   ? Difficulty of Paying Living Expenses: Not hard at all  ?Food Insecurity: No Food Insecurity  ? Worried About Charity fundraiser in the Last Year: Never true  ? Ran Out of Food in the Last Year: Never true  ?Transportation Needs: No Transportation Needs  ? Lack of Transportation (Medical): No  ? Lack of Transportation (Non-Medical): No  ?Physical Activity: Inactive  ? Days of Exercise per Week: 0 days  ? Minutes of Exercise per Session: 0 min  ?Stress: No Stress Concern Present  ? Feeling of Stress : Not at all  ?Social Connections: Moderately Isolated  ? Frequency of Communication with Friends and Family: More than three times a week  ? Frequency of Social Gatherings with Friends and Family: More than three times a week  ? Attends Religious Services: Never  ? Active Member of Clubs or Organizations: No  ? Attends Archivist Meetings: Never  ? Marital Status: Living with partner  ? ? ?Activities of Daily Living ? ?  10/11/2021  ?  4:09 PM  ?In your present state of health, do you have any difficulty performing the following activities:  ?Hearing? 1  ?Comment has noticed bilateral hearing loss.  ?Vision? 0  ?Difficulty concentrating or making decisions? 0  ?Walking or climbing stairs? 0  ?Dressing or bathing? 0  ?Doing errands, shopping? 0  ?Preparing Food and eating ? N  ?Using the Toilet? N  ?In the past six months, have you accidently leaked urine? N  ?Do you have problems with loss of bowel control? N  ?Managing your Medications? N  ?Managing your Finances? N  ?Housekeeping or managing your Housekeeping? N  ? ? ?Patient Education/ Literacy ?How often do you need to have someone help you when you read instructions, pamphlets, or other written materials  from your doctor or pharmacy?: 1 - Never ?What is the last grade level you completed in school?: 12th grade ? ?Exercise ?Current Exercise Habits: The patient does not participate in regular exercise at present, Exercise limited by: None identified ? ?Diet ?Patient reports consuming 2 meals a day and 1 snack(s) a day ?Patient reports that his primary diet is: Regular ?Patient reports that she does have regular access to food.  ? ?Depression Screen ? ?  10/11/2021  ?  4:07 PM 07/14/2020  ?  9:50 AM 04/21/2020  ?  9:27 AM 02/05/2019  ?  8:10  AM 12/29/2016  ? 10:03 AM  ?PHQ 2/9 Scores  ?PHQ - 2 Score 0 0 0 0 0  ?PHQ- 9 Score  0     ?  ? ?Fall Risk ? ?  10/11/2021  ?  4:07 PM 07/14/2020  ?  9:50 AM 04/21/2020  ?  9:27 AM 02/05/2019  ?  8:10 AM  ?Fall Risk   ?Falls in the past year? 0 0 0 0  ?Number falls in past yr: 0 0 0   ?Injury with Fall? 0 0 0   ?Risk for fall due to : No Fall Risks No Fall Risks    ?Follow up Falls evaluation completed Falls evaluation completed Falls evaluation completed Falls prevention discussed  ? ?  ?Objective:  ?Timothy Perkins seemed alert and oriented and he participated appropriately during our telephone visit. ? ?Blood Pressure Weight BMI  ?BP Readings from Last 3 Encounters:  ?10/03/21 (!) 152/74  ?07/25/21 127/65  ?07/04/21 124/60  ? Wt Readings from Last 3 Encounters:  ?10/03/21 169 lb (76.7 kg)  ?07/25/21 167 lb (75.8 kg)  ?07/04/21 163 lb (73.9 kg)  ? BMI Readings from Last 1 Encounters:  ?10/03/21 22.92 kg/m?  ?  ?*Unable to obtain current vital signs, weight, and BMI due to telephone visit type ? ?Hearing/Vision  ?Timothy Perkins did not seem to have difficulty with hearing/understanding during the telephone conversation ?Reports that he has not had a formal eye exam by an eye care professional within the past year ?Reports that he has not had a formal hearing evaluation within the past year ?*Unable to fully assess hearing and vision during telephone visit type ? ?Cognitive Function: ? ?  10/11/2021   ?  4:12 PM 07/14/2020  ?  9:50 AM 02/05/2019  ?  8:12 AM  ?6CIT Screen  ?What Year? 0 points 0 points 0 points  ?What month? 0 points 0 points 0 points  ?What time? 0 points 0 points 0 points  ?Count back f

## 2021-11-18 ENCOUNTER — Other Ambulatory Visit: Payer: Self-pay | Admitting: Physician Assistant

## 2021-11-18 DIAGNOSIS — I10 Essential (primary) hypertension: Secondary | ICD-10-CM

## 2021-11-30 ENCOUNTER — Ambulatory Visit (INDEPENDENT_AMBULATORY_CARE_PROVIDER_SITE_OTHER): Payer: Medicare HMO | Admitting: Physician Assistant

## 2021-11-30 ENCOUNTER — Encounter: Payer: Self-pay | Admitting: Physician Assistant

## 2021-11-30 VITALS — BP 171/76 | HR 72 | Ht 72.0 in | Wt 167.0 lb

## 2021-11-30 DIAGNOSIS — I10 Essential (primary) hypertension: Secondary | ICD-10-CM

## 2021-11-30 DIAGNOSIS — E785 Hyperlipidemia, unspecified: Secondary | ICD-10-CM

## 2021-11-30 DIAGNOSIS — M79604 Pain in right leg: Secondary | ICD-10-CM | POA: Diagnosis not present

## 2021-11-30 MED ORDER — LOSARTAN POTASSIUM 25 MG PO TABS: 25.0000 mg | ORAL_TABLET | Freq: Every day | ORAL | 1 refills | Status: DC

## 2021-11-30 NOTE — Progress Notes (Signed)
Acute Office Visit  Subjective:     Patient ID: Timothy Perkins, male    DOB: 16-Apr-1951, 71 y.o.   MRN: [id number]  Chief Complaint  Patient presents with   Leg Pain    Leg Pain  Pertinent negatives include no tingling.   Patient is in today for evaluation of right leg pain. The pain initially started on the left side of the neck, but moved to the  posterior aspect of the right proximal thigh with radiation down into the middle of the right calf. This pain started about 4 weeks ago, but has lessened in intensity over the last 4 days. He started atorvastatin 40 mg on 10/11/21. Timothy Perkins thought the pain was coming from the medication, so he discontinued the medication about a week ago. He does report a day of increased strenuous activity about a week ago that caused him greater pain. He takes tylenol daily, which helps with the pain. Pain is not worsened with movement. Denies fever, chills, nausea, vomiting, diarrhea, shortness of breath, chest pain, leg swelling, numbness, tingling, weakness.   Review of Systems  Constitutional:  Negative for chills and fever.  Respiratory:  Negative for cough, shortness of breath and wheezing.   Cardiovascular:  Negative for chest pain, palpitations, orthopnea, claudication and leg swelling.  Gastrointestinal:  Negative for diarrhea, nausea and vomiting.  Musculoskeletal:  Positive for myalgias and neck pain. Negative for back pain, falls and joint pain.  Neurological:  Negative for tingling, sensory change and weakness.  All other systems reviewed and are negative.       Objective:    BP (!) 171/76   Pulse 72   Ht 6' (1.829 m)   Wt 167 lb (75.8 kg)   SpO2 100%   BMI 22.65 kg/m  BP Readings from Last 3 Encounters:  11/30/21 (!) 171/76  10/03/21 (!) 152/74  07/25/21 127/65      Physical Exam Vitals reviewed.  Constitutional:      General: He is not in acute distress.    Appearance: Normal appearance. He is not ill-appearing.  Eyes:      General: No scleral icterus.    Extraocular Movements: Extraocular movements intact.     Conjunctiva/sclera: Conjunctivae normal.  Cardiovascular:     Rate and Rhythm: Normal rate and regular rhythm.     Heart sounds: Normal heart sounds.  Pulmonary:     Effort: Pulmonary effort is normal.     Breath sounds: Normal breath sounds.  Abdominal:     General: Abdomen is flat. There is no distension.     Palpations: Abdomen is soft. There is no mass.     Tenderness: There is no abdominal tenderness.  Musculoskeletal:        General: No swelling, tenderness or deformity. Normal range of motion.     Right lower leg: No edema.     Left lower leg: No edema.     Comments: Slight tingling noted in the popliteal region with straight leg raise on the right. No pain. Straight leg raise on the left normal.   Skin:    General: Skin is warm and dry.  Neurological:     Mental Status: He is alert and oriented to person, place, and time.     Sensory: No sensory deficit.     Motor: No weakness.     Deep Tendon Reflexes: Reflexes normal.  Psychiatric:        Mood and Affect: Mood normal.  Behavior: Behavior normal.          Assessment & Plan:   Timothy Perkins was seen today for leg pain.  Diagnoses and all orders for this visit:  Right leg pain  Essential hypertension, benign -     losartan (COZAAR) 25 MG tablet; Take 1 tablet (25 mg total) by mouth daily.  Hyperlipidemia LDL goal <100   Plan to change atorvastatin to 20 mg. We will see if his pain returns with continuation of the medication.  No evidence of DVT or PAD. Likely hamstring strain vs nerve compression.  Discussed warm compresses Home exercises given. BP elevated today. Per patient home readings were 120-130s over 80s. He does not think he took cozaar today. He will continue to monitor at home   [facility], [place]

## 2021-11-30 NOTE — Patient Instructions (Addendum)
Cut atorvastatin to '20mg'$  daily  Let me know in next 4 weeks how you are doing  Hamstring Strain Rehab Ask your health care provider which exercises are safe for you. Do exercises exactly as told by your health care provider and adjust them as directed. It is normal to feel mild stretching, pulling, tightness, or discomfort as you do these exercises. Stop right away if you feel sudden pain or your pain gets worse. Do not begin these exercises until told by your health care provider. Stretching and range-of-motion exercises These exercises warm up your muscles and joints and improve the movement and flexibility of your thighs. These exercises also help to relieve pain, numbness, and tingling. Talk to your health care provider about these restrictions. Knee extension, seated  Sit with your left / right heel propped on a chair, a coffee table, or a footstool. Do not have anything under your knee to support it. Allow your leg muscles to relax, letting gravity straighten out your knee (extension). You should feel a stretch behind your left / right knee. If told by your health care provider, deepen the stretch by placing a __________ weight on your thigh, just above your kneecap. Hold this position for __________ seconds. Repeat __________ times. Complete this exercise __________ times a day. Seated stretch This exercise is sometimes called hamstrings and adductors stretch. Sit on the floor with your legs stretched wide. Keep your knees straight during this exercise. Keeping your head and back in a straight line, bend at your waist to reach for your left foot. You should feel a stretch in your right inner thigh (adductors). Hold this position for __________ seconds. Then slowly return to the upright position. Keeping your head and back in a straight line, bend at your waist to reach forward. You should feel a stretch behind both of your thighs or knees (hamstrings). Hold this position for __________  seconds. Then slowly return to the upright position. Keeping your head and back in a straight line, bend at your waist to reach for your right foot. You should feel a stretch in your left inner thigh (adductors). Hold this position for __________ seconds. Then slowly return to the upright position. Repeat __________ times. Complete this exercise __________ times a day. Hamstrings stretch, supine  Lie on your back (supine position). Loop a belt or towel over the ball of your left / right foot. The ball of your foot is on the walking surface, right under your toes. Straighten your left / right knee and slowly pull on the belt or towel to raise your leg. Do not let your left / right knee bend while you do this. Keep your other leg flat on the floor. Raise the left / right leg until you feel a gentle stretch behind your left / right knee or thigh (hamstrings). Hold this position for __________ seconds. Slowly return your leg to the starting position. Repeat __________ times. Complete this exercise __________ times a day. Strengthening exercises These exercises build strength and endurance in your thighs. Endurance is the ability to use your muscles for a long time, even after they get tired. Straight leg raises, prone This exercise strengthens the muscles that move the hips (hip extensors). Lie on your abdomen on a firm surface (prone position). Tense the muscles in your buttocks and lift your left / right leg about 4 inches (10 cm). Keep your knee straight as you lift your leg. If you cannot lift your leg that high without arching your  back, place a pillow under your hips. Hold the position for __________ seconds. Slowly lower your leg to the starting position. Allow your muscles to relax completely before you start the next repetition. Repeat __________ times. Complete this exercise __________ times a day. Bridge This exercise strengthens the muscles in your buttocks and the back of your  thighs (hip extensors). Lie on your back on a firm surface with your knees bent and your feet flat on the floor. Tighten your buttocks muscles and lift your bottom off the floor until the trunk of your body is level with your thighs. You should feel the muscles working in your buttocks and the back of your thighs. Do not arch your back. Hold this position for __________ seconds. Slowly lower your hips to the starting position. Let your buttocks muscles relax completely between repetitions. If told by your health care provider, keep your bottom lifted off the floor while you slowly walk your feet away from you as far as you can control. Hold for __________ seconds, then slowly walk your feet back toward you. Repeat __________ times. Complete this exercise __________ times a day. Lateral walking with band This is an exercise in which you walk sideways (lateral), with tension provided by an exercise band. The exercise strengthens the muscles in your hip (hip abductors). Stand in a long hallway. Wrap a loop of exercise band around your legs, just above your knees. Bend your knees gently and drop your hips down and back so your weight is over your heels. Step to the side to move down the length of the hallway, keeping your toes pointed ahead of you and keeping tension in the band. Repeat, leading with your other leg. Repeat __________ times. Complete this exercise __________ times a day. Single leg stand with reaching This exercise is also called eccentric hamstring stretch. Stand on your left / right foot. Keep your big toe down on the floor and try to keep your arch lifted. Slowly reach down toward the floor as far as you can while keeping your balance. Lowering your thigh under tension is called eccentric stretching. Hold this position for __________ seconds. Repeat __________ times. Complete this exercise __________ times a day. Plank, prone This exercise strengthens muscles in your abdomen  and core area. Lie on your abdomen on the floor (prone position),and prop yourself up on your elbows. Your hands should be straight out in front of you, and your elbows should be below your shoulders. Position your feet similar to a push-up position so your toes are on the ground. Tighten your abdominal muscles and lift your body off the floor. Do not arch your back. Do not hold your breath. Hold this position for __________ seconds. Repeat __________ times. Complete this exercise __________ times a day. This information is not intended to replace advice given to you by your health care provider. Make sure you discuss any questions you have with your health care provider. Document Revised: 11/22/2020 Document Reviewed: 11/22/2020 Elsevier Patient Education  [place].

## 2022-01-23 ENCOUNTER — Encounter: Payer: Self-pay | Admitting: Physician Assistant

## 2022-01-23 ENCOUNTER — Ambulatory Visit (INDEPENDENT_AMBULATORY_CARE_PROVIDER_SITE_OTHER): Payer: Medicare HMO | Admitting: Physician Assistant

## 2022-01-23 VITALS — Ht 72.0 in | Wt 160.0 lb

## 2022-01-23 DIAGNOSIS — F4323 Adjustment disorder with mixed anxiety and depressed mood: Secondary | ICD-10-CM

## 2022-01-23 DIAGNOSIS — I10 Essential (primary) hypertension: Secondary | ICD-10-CM | POA: Diagnosis not present

## 2022-01-23 DIAGNOSIS — F4322 Adjustment disorder with anxiety: Secondary | ICD-10-CM

## 2022-01-23 MED ORDER — HYDROXYZINE HCL 10 MG PO TABS: 10.0000 mg | ORAL_TABLET | Freq: Three times a day (TID) | ORAL | 0 refills | Status: DC | PRN

## 2022-01-23 NOTE — Progress Notes (Unsigned)
Acute Office Visit  Subjective:     Patient ID: Timothy Perkins, male    DOB: 04-29-1951, 71 y.o.   MRN: [id number]  Chief Complaint  Patient presents with   Follow-up   Dizziness    HPI Patient is in today for blood pressure follow up.   His girlfriend of 3 years broke up with him 3 weeks ago. Since then he has not been eating and has been really sad. His BP started dropping into the 100s over 50s. He was feeling dizzy. He stopped his blood pressure medication. Now his BP is high again. He denies any CP, palpitations, headaches or vision changes.   He admits he has not felt this sad since his wife died.  No SI/HC.   Marland Kitchen. Active Ambulatory Problems    Diagnosis Date Noted   Alcohol abuse 01/07/2014   Gastritis and duodenitis 01/08/2014   Anxiety state 09/18/2014   COPD exacerbation ([place]) 02/25/2016   Tobacco dependency 02/25/2016   Chronic bronchitis ([place]) 03/08/2016   Essential hypertension, benign 06/16/2016   Gastroesophageal reflux disease with esophagitis 02/22/2018   Seasonal allergies 02/22/2018   Solar lentigo 04/17/2018   Actinic keratosis 04/17/2018   Melanoma of skin ([place]) 04/19/2018   Current smoker 04/23/2020   Hyperlipidemia 05/30/2021   Hyperlipidemia LDL goal <100 06/07/2021   Left-sided chest pain 07/04/2021   Hyperlipidemia associated with type 2 diabetes mellitus ([place]) 07/04/2021   Abnormal EKG 07/04/2021   Abdominal aortic atherosclerosis ([place]) 07/13/2021   Fatty liver 07/21/2021   Neck pain on left side 07/26/2021   Emphysema lung ([place]) 08/20/2021   Aortic atherosclerosis ([place]) 10/03/2021   DDD (degenerative disc disease), cervical 10/03/2021   Gastroesophageal reflux disease 10/04/2021   Right leg pain 11/30/2021   Resolved Ambulatory Problems    Diagnosis Date Noted   Depression 01/07/2014   Past Medical History:  Diagnosis Date   COPD (chronic obstructive pulmonary disease) ([place])    Hypertension    Lazy eye of left side       ROS  See HPI.     Objective:    Ht 6' (1.829 m)   Wt 160 lb (72.6 kg)   BMI 21.70 kg/m  BP Readings from Last 3 Encounters:  11/30/21 (!) 171/76  10/03/21 (!) 152/74  07/25/21 127/65   Wt Readings from Last 3 Encounters:  01/23/22 160 lb (72.6 kg)  11/30/21 167 lb (75.8 kg)  10/03/21 169 lb (76.7 kg)    ..    10/11/2021    4:07 PM 07/14/2020    9:50 AM 04/21/2020    9:27 AM 02/05/2019    8:10 AM 12/29/2016   10:03 AM  Depression screen PHQ 2/9  Decreased Interest 0 0 0 0 0  Down, Depressed, Hopeless 0 0 0 0 0  PHQ - 2 Score 0 0 0 0 0  Altered sleeping  0     Tired, decreased energy  0     Change in appetite  0     Feeling bad or failure about yourself   0     Trouble concentrating  0     Moving slowly or fidgety/restless  0     Suicidal thoughts  0     PHQ-9 Score  0           Physical Exam Constitutional:      Appearance: Normal appearance.  HENT:     Head: Normocephalic.  Cardiovascular:     Rate and Rhythm: Normal rate  and regular rhythm.     Pulses: Normal pulses.  Pulmonary:     Effort: Pulmonary effort is normal.  Neurological:     General: No focal deficit present.     Mental Status: He is alert and oriented to person, place, and time.  Psychiatric:        Mood and Affect: Mood normal.          Assessment & Plan:  Marland KitchenMarland KitchenMillan was seen today for follow-up and dizziness.  Diagnoses and all orders for this visit:  Adjustment disorder with mixed anxiety and depressed mood -     hydrOXYzine (ATARAX) 10 MG tablet; Take 1 tablet (10 mg total) by mouth 3 (three) times daily as needed. For anxiety.  Essential hypertension, benign   Likely major diet and hydration changes caused BP to drop Start back with 1/2 tablet of cozaar if BP above 150/90 then add full tablet and follow up in 2 weeks  Pt declines counseling or daily medication for mood Vistaril sent to use as needed for anxiety and depression   Follow up in 4 weeks.    Iran Planas, PA-C

## 2022-01-23 NOTE — Patient Instructions (Addendum)
Hydroxyzine as needed for anxiety/mood.  1/2 tablet of cozaar above 150/90 start full tablet

## 2022-03-28 ENCOUNTER — Ambulatory Visit (INDEPENDENT_AMBULATORY_CARE_PROVIDER_SITE_OTHER): Payer: Medicare HMO | Admitting: Pharmacist

## 2022-03-28 DIAGNOSIS — F17200 Nicotine dependence, unspecified, uncomplicated: Secondary | ICD-10-CM

## 2022-03-28 DIAGNOSIS — J42 Unspecified chronic bronchitis: Secondary | ICD-10-CM

## 2022-03-28 DIAGNOSIS — I10 Essential (primary) hypertension: Secondary | ICD-10-CM

## 2022-03-28 DIAGNOSIS — E785 Hyperlipidemia, unspecified: Secondary | ICD-10-CM

## 2022-03-28 NOTE — Patient Instructions (Signed)
Timothy Perkins,  It was great chatting today! Don't forget to restart the cholesterol medication and see how you feel. This one is great to lower your risks of heart attack and stroke. It should not impact your blood pressure.  I will chat with you in 1 month over the phone to see how this is going!  Thanks, Luana Shu, PharmD Clinical Pharmacist [facility] [phone]

## 2022-03-28 NOTE — Progress Notes (Signed)
Chronic Care Management Pharmacy Note  03/28/2022 Name:  Timothy Perkins MRN:  [id number] DOB:  May 08, 1951  Summary: addressed HTN, COPD, tobacco use.   Patient reports not taking BP or cholesterol medicine. Still doing trelegy inhaler.   BP: 126/66, 136/75, 131/72, 132/63, 134/69 on no blood pressure medication.   He reports nutrition changes after separation of significant other. Lost some weight. Smoking 1pd or little less.  Recommendations/Changes made from today's visit:  - no changes, but provided adherence counseling to restart cholesterol medication as this should not contribute to blood pressure. Patient verbalized understanding and agreed to retry this medication.  Plan: f/u with pharmacist in 1 month  Subjective: Timothy Perkins is an 71 y.o. year old male who is a primary patient of Alden Hipp, Royetta Car, PA-C.  The CCM team was consulted for assistance with disease management and care coordination needs.    Engaged with patient by telephone for follow up visit in response to provider referral for pharmacy case management and/or care coordination services.   Consent to Services:  The patient was given information about Chronic Care Management services, agreed to services, and gave verbal consent prior to initiation of services.  Please see initial visit note for detailed documentation.   Patient Care Team: Lavada Mesi as PCP - General (Family Medicine) Darius Bump, [facility] as Pharmacist (Pharmacist)  Recent office visits:  None noted    Recent consult visits:  12/22/20 [place] for routine examination - No notes available    Hospital visits:  None in previous 6 months  Objective:  Lab Results  Component Value Date   CREATININE 0.80 07/18/2021   CREATININE 0.80 05/25/2021   CREATININE 0.77 10/08/2019    No results found for: "HGBA1C" Last diabetic Eye exam: No results found for: "HMDIABEYEEXA"  Last diabetic Foot exam: No  results found for: "HMDIABFOOTEX"      Component Value Date/Time   CHOL 216 (H) 05/25/2021 0000   TRIG 153 (H) 05/25/2021 0000   HDL 52 05/25/2021 0000   CHOLHDL 4.2 05/25/2021 0000   LDLCALC 136 (H) 05/25/2021 0000       Latest Ref Rng & Units 05/25/2021   12:00 AM 10/08/2019    9:31 AM  Hepatic Function  Total Protein 6.1 - 8.1 g/dL 6.6  6.7   AST 10 - 35 U/L 20  21   ALT 9 - 46 U/L 23  26   Total Bilirubin 0.2 - 1.2 mg/dL 0.8  0.7     Lab Results  Component Value Date/Time   TSH 1.11 05/25/2021 12:00 AM       Latest Ref Rng & Units 05/25/2021   12:00 AM 10/08/2019    9:31 AM  CBC  WBC 3.8 - 10.8 Thousand/uL 7.6  8.6   Hemoglobin 13.2 - 17.1 g/dL 15.7  16.7   Hematocrit 38.5 - 50.0 % 45.3  47.7   Platelets 140 - 400 Thousand/uL 167  175      Social History   Tobacco Use  Smoking Status Every Day   Packs/day: 1.00   Years: 60.00   Total pack years: 60.00   Types: Cigarettes  Smokeless Tobacco Never   BP Readings from Last 3 Encounters:  11/30/21 (!) 171/76  10/03/21 (!) 152/74  07/25/21 127/65   Pulse Readings from Last 3 Encounters:  11/30/21 72  10/03/21 90  07/25/21 99   Wt Readings from Last 3 Encounters:  01/23/22 160 lb (  72.6 kg)  11/30/21 167 lb (75.8 kg)  10/03/21 169 lb (76.7 kg)    Assessment: Review of patient past medical history, allergies, medications, health status, including review of consultants reports, laboratory and other test data, was performed as part of comprehensive evaluation and provision of chronic care management services.   SDOH:  (Social Determinants of Health) assessments and interventions performed:  SDOH Interventions    [place] Office Visit from 07/14/2020 in [place] Interventions Intervention Not Indicated  Housing Interventions Intervention Not Indicated  Transportation Interventions Intervention Not Indicated  Financial  Strain Interventions Intervention Not Indicated  Physical Activity Interventions Intervention Not Indicated  Stress Interventions Intervention Not Indicated  Social Connections Interventions Intervention Not Indicated       CCM Care Plan  Allergies  Allergen Reactions   Lisinopril     "loopy"    Medications Reviewed Today     Reviewed by Lavada Mesi (Physician Assistant) on 01/23/22 at 1317  Med List Status: <None>   Medication Order Taking? Sig Documenting Provider Last Dose Status Informant  albuterol (PROVENTIL HFA;VENTOLIN HFA) 108 (90 Base) MCG/ACT inhaler [id number] Yes Inhale 2 puffs into the lungs every 6 (six) hours as needed for wheezing or shortness of breath. Donella Stade, PA-C Taking Active   atorvastatin (LIPITOR) 40 MG tablet [id number] Yes Take 20 mg by mouth daily. [provider] Taking Active   cetirizine (ZYRTEC) 10 MG tablet [id number] Yes TAKE 1 TABLET EVERY DAY. Donella Stade, PA-C Taking Active   famotidine (PEPCID) 20 MG tablet [id number] Yes Take 20 mg by mouth 2 (two) times daily. [provider] Taking Active   Fluticasone-Umeclidin-Vilant (TRELEGY ELLIPTA) 100-62.5-25 MCG/ACT AEPB [id number] Yes Inhale 1 puff into the lungs daily. Donella Stade, PA-C Taking Active   losartan (COZAAR) 25 MG tablet [id number] Yes Take 1 tablet (25 mg total) by mouth daily.  Patient taking differently: Take 12.5 mg by mouth daily.   Donella Stade, PA-C Taking Active             Patient Active Problem List   Diagnosis Date Noted   Right leg pain 11/30/2021   Gastroesophageal reflux disease 10/04/2021   Aortic atherosclerosis ([place]) 10/03/2021   DDD (degenerative disc disease), cervical 10/03/2021   Emphysema lung ([facility]) 08/20/2021   Neck pain on left side 07/26/2021   Fatty liver 07/21/2021   Abdominal aortic atherosclerosis ([place]) 07/13/2021   Left-sided chest pain 07/04/2021   Hyperlipidemia associated with type 2 diabetes  mellitus ([place]) 07/04/2021   Abnormal EKG 07/04/2021   Hyperlipidemia LDL goal <100 06/07/2021   Hyperlipidemia 05/30/2021   Current smoker 04/23/2020   Melanoma of skin ([place]) 04/19/2018   Solar lentigo 04/17/2018   Actinic keratosis 04/17/2018   Gastroesophageal reflux disease with esophagitis 02/22/2018   Seasonal allergies 02/22/2018   Essential hypertension, benign 06/16/2016   Chronic bronchitis ([place]) 03/08/2016   COPD exacerbation ([place]) 02/25/2016   Tobacco dependency 02/25/2016   Anxiety state 09/18/2014   Gastritis and duodenitis 01/08/2014   Alcohol abuse 01/07/2014    Immunization History  Administered Date(s) Administered   Fluad Quad(high Dose 65+) 04/21/2020, 03/25/2022   Influenza, High Dose Seasonal PF 04/15/2018, 04/30/2019   Influenza,inj,Quad PF,6+ Mos 06/16/2016   Influenza-Unspecified 03/31/2021   Moderna Sars-Covid-2 Vaccination 06/25/2020, 03/31/2021   Pneumococcal Conjugate-13 06/16/2016   Pneumococcal Polysaccharide-23 04/15/2018   Respiratory Syncytial Virus Vaccine,Recomb Aduvanted(Arexvy) 03/25/2022   Zoster Recombinat (  Shingrix) 06/12/2021, 12/29/2021    Conditions to be addressed/monitored: HTN, COPD, and tobacco use  There are no care plans that you recently modified to display for this patient.   Medication Assistance:  Trelegy is cost prohibitve but patient declined offer for patient assistance.  Patient's preferred pharmacy is:  [facility] [place] [place] [address] Phone: [phone] Fax: [phone]  [id number] [id number] [place] [address] [id number]: [phone]: [phone] Uses pill box? No - has a system   Pt endorses 100% compliance  Follow Up:  Patient agrees to Care Plan and Follow-up.  Plan: Telephone follow up appointment with care management team member scheduled for:  1  month  KLarinda Buttery PharmD Clinical Pharmacist [phone]

## 2022-04-03 ENCOUNTER — Ambulatory Visit (INDEPENDENT_AMBULATORY_CARE_PROVIDER_SITE_OTHER): Payer: Medicare HMO | Admitting: Physician Assistant

## 2022-04-03 VITALS — BP 152/88 | HR 78 | Ht 72.0 in | Wt 160.0 lb

## 2022-04-03 DIAGNOSIS — E785 Hyperlipidemia, unspecified: Secondary | ICD-10-CM

## 2022-04-03 DIAGNOSIS — N529 Male erectile dysfunction, unspecified: Secondary | ICD-10-CM | POA: Diagnosis not present

## 2022-04-03 DIAGNOSIS — Z79899 Other long term (current) drug therapy: Secondary | ICD-10-CM

## 2022-04-03 DIAGNOSIS — F4323 Adjustment disorder with mixed anxiety and depressed mood: Secondary | ICD-10-CM

## 2022-04-03 DIAGNOSIS — Z125 Encounter for screening for malignant neoplasm of prostate: Secondary | ICD-10-CM | POA: Diagnosis not present

## 2022-04-03 DIAGNOSIS — F419 Anxiety disorder, unspecified: Secondary | ICD-10-CM

## 2022-04-03 DIAGNOSIS — R351 Nocturia: Secondary | ICD-10-CM

## 2022-04-03 DIAGNOSIS — I10 Essential (primary) hypertension: Secondary | ICD-10-CM

## 2022-04-03 MED ORDER — HYDROXYZINE HCL 10 MG PO TABS: 10.0000 mg | ORAL_TABLET | Freq: Three times a day (TID) | ORAL | 2 refills | Status: DC | PRN

## 2022-04-03 MED ORDER — TAMSULOSIN HCL 0.4 MG PO CAPS: 0.4000 mg | ORAL_CAPSULE | Freq: Every day | ORAL | 1 refills | Status: DC

## 2022-04-03 MED ORDER — SILDENAFIL CITRATE 20 MG PO TABS: 20.0000 mg | ORAL_TABLET | ORAL | 11 refills | Status: DC | PRN

## 2022-04-03 NOTE — Patient Instructions (Signed)

## 2022-04-03 NOTE — Progress Notes (Signed)
Established Patient Office Visit  Subjective   Patient ID: MALAKAI SCHOENHERR, male    DOB: [birth year]  Age: 71 y.o. MRN: [id number]  Chief Complaint  Patient presents with   Follow-up    HPI Pt is a 71 yo male with HTN, HLD, aortic atherosclerosis, COPD who presents to the clinic for follow up and medication discussion.   He is not taking losartan. He checks BP and goes too low and feels bad. As low as 105/60. Checking without medication at home and running 120s/70s. No CP, headaches, palpitations.   He is using trelegy and doing well. If he does not use it he does notice by the 3rd day.   He does not want to take lipitor. Feels like affects his BP.   He is using hydroxyzine as needed for anxiety and working well. Needs refills.   in a new relationship and needs help with erection. Requesting viagra.   He does have to get up a few times a night to urinate. He does drink alcohol nightly before bed. Does not notice increase urination during the day.   Patient Active Problem List   Diagnosis Date Noted   Right leg pain 11/30/2021   Gastroesophageal reflux disease 10/04/2021   Aortic atherosclerosis ([place]) 10/03/2021   DDD (degenerative disc disease), cervical 10/03/2021   Emphysema lung ([place]) 08/20/2021   Neck pain on left side 07/26/2021   Fatty liver 07/21/2021   Abdominal aortic atherosclerosis ([place]) 07/13/2021   Left-sided chest pain 07/04/2021   Hyperlipidemia associated with type 2 diabetes mellitus ([place]) 07/04/2021   Abnormal EKG 07/04/2021   Hyperlipidemia LDL goal <100 06/07/2021   Hyperlipidemia 05/30/2021   Current smoker 04/23/2020   Melanoma of skin ([place]) 04/19/2018   Solar lentigo 04/17/2018   Actinic keratosis 04/17/2018   Gastroesophageal reflux disease with esophagitis 02/22/2018   Seasonal allergies 02/22/2018   Essential hypertension, benign 06/16/2016   Chronic bronchitis ([place]) 03/08/2016   COPD exacerbation ([place]) 02/25/2016   Tobacco dependency  02/25/2016   Anxiety 09/18/2014   Gastritis and duodenitis 01/08/2014   Alcohol abuse 01/07/2014   Past Medical History:  Diagnosis Date   COPD (chronic obstructive pulmonary disease) ([place])    Hypertension    Lazy eye of left side    Family History  Problem Relation Age of Onset   Cancer Father        Lung/smoker   Kidney disease Sister    Allergies  Allergen Reactions   Lisinopril     "loopy"      Review of Systems  All other systems reviewed and are negative.    Objective:     BP (!) 152/88 Comment: home machine  Pulse 78   Ht 6' (1.829 m)   Wt 160 lb (72.6 kg)   SpO2 100%   BMI 21.70 kg/m  BP Readings from Last 3 Encounters:  04/03/22 (!) 152/88  11/30/21 (!) 171/76  10/03/21 (!) 152/74   Wt Readings from Last 3 Encounters:  04/03/22 160 lb (72.6 kg)  01/23/22 160 lb (72.6 kg)  11/30/21 167 lb (75.8 kg)      Physical Exam Constitutional:      Appearance: Normal appearance.  Cardiovascular:     Rate and Rhythm: Normal rate and regular rhythm.     Pulses: Normal pulses.  Pulmonary:     Effort: Pulmonary effort is normal.     Breath sounds: Normal breath sounds.  Neurological:     General: No focal deficit present.  Mental Status: He is alert and oriented to person, place, and time.  Psychiatric:        Mood and Affect: Mood normal.         Assessment & Plan:  Marland KitchenMarland KitchenRayshawn was seen today for follow-up.  Diagnoses and all orders for this visit:  Essential hypertension, benign -     COMPLETE METABOLIC PANEL WITH GFR  Hyperlipidemia LDL goal <100 -     Lipid Panel w/reflex Direct LDL  Screening PSA (prostate specific antigen) -     PSA  Medication management -     Lipid Panel w/reflex Direct LDL -     COMPLETE METABOLIC PANEL WITH GFR -     CBC with Differential/Platelet -     PSA  Anxiety -     hydrOXYzine (ATARAX) 10 MG tablet; Take 1 tablet (10 mg total) by mouth 3 (three) times daily as needed. For anxiety.  Nocturia -      tamsulosin (FLOMAX) 0.4 MG CAPS capsule; Take 1 capsule (0.4 mg total) by mouth daily after supper.  Erectile dysfunction, unspecified erectile dysfunction type -     sildenafil (REVATIO) 20 MG tablet; Take 1-5 tablets (20-100 mg total) by mouth as needed.   BP elevated in office but home readings are good Ok to stay on cozaar for now Discussed importance of stayin on lipitor Check lipid panel today Refilled hydroxyzine for anxiety as needed Revatio for ED as needed Flomax for nocturia PSA ordered Cmp ordered.  Continue trelelegy for COPD.  Follow up in 6 months.     Iran Planas, PA-C

## 2022-04-04 ENCOUNTER — Encounter: Payer: Self-pay | Admitting: Physician Assistant

## 2022-04-04 LAB — PSA: PSA: 1 ng/mL (ref <=4.00)

## 2022-04-04 LAB — COMPLETE METABOLIC PANEL WITH GFR
AG Ratio: 1.9 (calc) (ref 1.0–2.5)
ALT: 22 U/L (ref 9–46)
AST: 19 U/L (ref 10–35)
Albumin: 4.5 g/dL (ref 3.6–5.1)
Alkaline phosphatase (APISO): 100 U/L (ref 35–144)
BUN: 12 mg/dL (ref 7–25)
CO2: 28 mmol/L (ref 20–32)
Calcium: 9.4 mg/dL (ref 8.6–10.3)
Chloride: 102 mmol/L (ref 98–110)
Creat: 0.83 mg/dL (ref 0.70–1.28)
Globulin: 2.4 g/dL (calc) (ref 1.9–3.7)
Glucose, Bld: 95 mg/dL (ref 65–99)
Potassium: 4.6 mmol/L (ref 3.5–5.3)
Sodium: 139 mmol/L (ref 135–146)
Total Bilirubin: 0.9 mg/dL (ref 0.2–1.2)
Total Protein: 6.9 g/dL (ref 6.1–8.1)
eGFR: 92 mL/min/{1.73_m2} (ref >=60)

## 2022-04-04 LAB — CBC WITH DIFFERENTIAL/PLATELET
Absolute Monocytes: 544 cells/uL (ref 200–950)
Basophils Absolute: 72 cells/uL (ref 0–200)
Basophils Relative: 0.9 %
Eosinophils Absolute: 200 cells/uL (ref 15–500)
Eosinophils Relative: 2.5 %
HCT: 49.1 % (ref 38.5–50.0)
Hemoglobin: 16.9 g/dL (ref 13.2–17.1)
Lymphs Abs: 2264 cells/uL (ref 850–3900)
MCH: 32.7 pg (ref 27.0–33.0)
MCHC: 34.4 g/dL (ref 32.0–36.0)
MCV: 95 fL (ref 80.0–100.0)
MPV: 10.6 fL (ref 7.5–12.5)
Monocytes Relative: 6.8 %
Neutro Abs: 4920 cells/uL (ref 1500–7800)
Neutrophils Relative %: 61.5 %
Platelets: 185 10*3/uL (ref 140–400)
RBC: 5.17 10*6/uL (ref 4.20–5.80)
RDW: 11.8 % (ref 11.0–15.0)
Total Lymphocyte: 28.3 %
WBC: 8 10*3/uL (ref 3.8–10.8)

## 2022-04-04 LAB — LIPID PANEL W/REFLEX DIRECT LDL
Cholesterol: 166 mg/dL (ref <200)
HDL: 61 mg/dL (ref >=40)
LDL Cholesterol (Calc): 86 mg/dL (calc)
Non-HDL Cholesterol (Calc): 105 mg/dL (calc) (ref <130)
Total CHOL/HDL Ratio: 2.7 (calc) (ref <5.0)
Triglycerides: 97 mg/dL (ref <150)

## 2022-04-04 NOTE — Progress Notes (Signed)
[place],   Cholesterol looks so much better than 10 months ago.  Kidney, liver, glucose look great.  PSA is low and looks good.  Hemoglobin looks good.   Your labs look really good.

## 2022-05-05 ENCOUNTER — Other Ambulatory Visit: Payer: Self-pay | Admitting: Physician Assistant

## 2022-06-15 ENCOUNTER — Encounter: Payer: Self-pay | Admitting: Physician Assistant

## 2022-06-15 ENCOUNTER — Other Ambulatory Visit: Payer: Self-pay | Admitting: Physician Assistant

## 2022-06-15 DIAGNOSIS — J302 Other seasonal allergic rhinitis: Secondary | ICD-10-CM

## 2022-07-20 ENCOUNTER — Telehealth: Payer: Self-pay | Admitting: Acute Care

## 2022-07-20 NOTE — Telephone Encounter (Signed)
Spoke to pt and he stated he no longer wants to have the lung cancer screening. He wouldn't tell me why, he just stated he no longer wanted it. I informed him I will let Eric Form, NP know. Pt verbalized understanding. Nothing further needed.

## 2022-07-21 ENCOUNTER — Encounter: Payer: Self-pay | Admitting: Physician Assistant

## 2022-07-21 DIAGNOSIS — I2510 Atherosclerotic heart disease of native coronary artery without angina pectoris: Secondary | ICD-10-CM | POA: Insufficient documentation

## 2022-07-31 ENCOUNTER — Ambulatory Visit (INDEPENDENT_AMBULATORY_CARE_PROVIDER_SITE_OTHER): Payer: Medicare HMO | Admitting: Family Medicine

## 2022-07-31 ENCOUNTER — Encounter: Payer: Self-pay | Admitting: Family Medicine

## 2022-07-31 VITALS — BP 133/67 | HR 86 | Ht 72.0 in | Wt 164.0 lb

## 2022-07-31 DIAGNOSIS — J069 Acute upper respiratory infection, unspecified: Secondary | ICD-10-CM | POA: Diagnosis not present

## 2022-07-31 NOTE — Progress Notes (Signed)
Timothy Perkins - 72 y.o. male MRN [id number]  Date of birth: 07-12-1950  Subjective Chief Complaint  Patient presents with   Nasal Congestion    HPI Timothy Perkins is a 72 y.o. male here today with complaint of congestion and rhinorrhea.  Symptoms started about 5-6 days ago.  He has tried mucinex and tylenol which as been effective for him.  He has not had fever.  He denies wheezing or dyspnea.  He is a daily smoker.  He is staying well hydrated.  He does reports that he is feeling better today compared to the past few days.   ROS:  A comprehensive ROS was completed and negative except as noted per HPI  Allergies  Allergen Reactions   Lisinopril     "loopy"    Past Medical History:  Diagnosis Date   COPD (chronic obstructive pulmonary disease) (HCC)    Hypertension    Lazy eye of left side     Past Surgical History:  Procedure Laterality Date   EYE SURGERY Left     Social History   Socioeconomic History   Marital status: Soil scientist    Spouse name: Not on file   Number of children: 2   Years of education: 12   Highest education level: 12th grade  Occupational History   Occupation: Games developer    Comment: retired  Tobacco Use   Smoking status: Every Day    Packs/day: 1.00    Years: 60.00    Total pack years: 60.00    Types: Cigarettes   Smokeless tobacco: Never  Vaping Use   Vaping Use: Never used  Substance and Sexual Activity   Alcohol use: Yes    Alcohol/week: 35.0 standard drinks of alcohol    Types: 35 Cans of beer per week   Drug use: No   Sexual activity: Yes  Other Topics Concern   Not on file  Social History Narrative   Lives with his girlfriend. Takes care of farm and animals. Likes to ride motorcycle.    Social Determinants of Health   Financial Resource Strain: Low Risk  (10/11/2021)   Overall Financial Resource Strain (CARDIA)    Difficulty of Paying Living Expenses: Not hard at all  Food Insecurity: No Food Insecurity (10/11/2021)    Hunger Vital Sign    Worried About Running Out of Food in the Last Year: Never true    Ran Out of Food in the Last Year: Never true  Transportation Needs: No Transportation Needs (10/11/2021)   PRAPARE - Hydrologist (Medical): No    Lack of Transportation (Non-Medical): No  Physical Activity: Inactive (10/11/2021)   Exercise Vital Sign    Days of Exercise per Week: 0 days    Minutes of Exercise per Session: 0 min  Stress: No Stress Concern Present (10/11/2021)   [place]    Feeling of Stress : Not at all  Social Connections: Moderately Isolated (10/11/2021)   Social Connection and Isolation Panel [NHANES]    Frequency of Communication with Friends and Family: More than three times a week    Frequency of Social Gatherings with Friends and Family: More than three times a week    Attends Religious Services: Never    Marine scientist or Organizations: No    Attends Archivist Meetings: Never    Marital Status: Living with partner    Family History  Problem Relation Age  of Onset   Cancer Father        Lung/smoker   Kidney disease Sister     Health Maintenance  Topic Date Due   HEMOGLOBIN A1C  Never done   FOOT EXAM  Never done   OPHTHALMOLOGY EXAM  Never done   DTaP/Tdap/Td (1 - Tdap) Never done   Lung Cancer Screening  08/16/2022   COVID-19 Vaccine (4 - 2023-24 season) 08/16/2023 (Originally 06/16/2022)   Diabetic kidney evaluation - Urine ACR  04/04/2027 (Originally 09/24/1966)   Medicare Annual Wellness (AWV)  10/12/2022   Diabetic kidney evaluation - eGFR measurement  04/04/2023   Pneumonia Vaccine 70+ Years old  Completed   INFLUENZA VACCINE  Completed   Hepatitis C Screening  Completed   Zoster Vaccines- Shingrix  Completed   HPV VACCINES  Aged Out   Fecal DNA (Cologuard)  Discontinued      ----------------------------------------------------------------------------------------------------------------------------------------------------------------------------------------------------------------- Physical Exam BP 133/67 (BP Location: Right Arm, Patient Position: Sitting, Cuff Size: Normal)   Pulse 86   Ht 6' (1.829 m)   Wt 164 lb (74.4 kg)   SpO2 98%   BMI 22.24 kg/m   Physical Exam Constitutional:      Appearance: Normal appearance.  HENT:     Head: Normocephalic and atraumatic.  Eyes:     General: No scleral icterus. Cardiovascular:     Rate and Rhythm: Normal rate and regular rhythm.  Pulmonary:     Effort: Pulmonary effort is normal.     Breath sounds: Normal breath sounds.  Neurological:     General: No focal deficit present.     Mental Status: He is alert.     ------------------------------------------------------------------------------------------------------------------------------------------------------------------------------------------------------------------- Assessment and Plan  Acute URI Symptoms improving today.  Recommend continued supportive care with increased fluids.  He may continue mucinex as needed.  I asked him to call back if having worsening symptoms including new lower respiratory symptoms such as wheezing or increased sputum production.     No orders of the defined types were placed in this encounter.   No follow-ups on file.    This visit occurred during the SARS-CoV-2 public health emergency.  Safety protocols were in place, including screening questions prior to the visit, additional usage of staff PPE, and extensive cleaning of exam room while observing appropriate contact time as indicated for disinfecting solutions.

## 2022-07-31 NOTE — Assessment & Plan Note (Signed)
Symptoms improving today.  Recommend continued supportive care with increased fluids.  He may continue mucinex as needed.  I asked him to call back if having worsening symptoms including new lower respiratory symptoms such as wheezing or increased sputum production.

## 2022-08-16 ENCOUNTER — Ambulatory Visit: Payer: Medicare HMO

## 2022-09-11 ENCOUNTER — Other Ambulatory Visit: Payer: Medicare HMO | Admitting: Pharmacist

## 2022-09-11 NOTE — Patient Instructions (Addendum)
Legrand Como,  Thank you for speaking with me today. We reviewed your medications in preparation for your upcoming visit with Jade on 10/06/22.  Continue taking medications regularly, checking blood pressure at home, and providing those numbers at your next office visit.   Take care, Luana Shu, PharmD, [facility]

## 2022-09-11 NOTE — Progress Notes (Signed)
09/11/2022 Name: WINNIE RUNNION MRN: [id number] DOB: 1951-04-14  Chief Complaint  Patient presents with   Medication Management    DAJION BRIAR is a 72 y.o. year old male who presented for a telephone visit.   They were referred to the pharmacist by a quality report for assistance in managing  quality metrics - controlling blood pressure (CBP) and medication adherence cholesterol (MAC) .    Subjective:  Care Team: Primary Care Provider: Lavada Mesi ; Next Scheduled Visit: 10/06/22  Medication Access/Adherence  Current Pharmacy:  [facility] - [place], [place] [address] Phone: [phone] Fax: [phone]  [id number] - [address], [place] - [place] [address] [place] [address] Phone: [phone] Fax: [phone]   Patient reports affordability concerns with their medications: Yes trelegy when in donut hole but pt has declined patient assistance program in the past Patient reports access/transportation concerns to their pharmacy: No  Patient reports adherence concerns with their medications:  No     Medication Management:  Current adherence strategy: adequate  Patient reports Good adherence to medications  Patient reports the following barriers to adherence: none at present   Objective:  No results found for: "HGBA1C"  Lab Results  Component Value Date   CREATININE 0.83 04/03/2022   BUN 12 04/03/2022   NA 139 04/03/2022   K 4.6 04/03/2022   CL 102 04/03/2022   CO2 28 04/03/2022    Lab Results  Component Value Date   CHOL 166 04/03/2022   HDL 61 04/03/2022   LDLCALC 86 04/03/2022   TRIG 97 04/03/2022   CHOLHDL 2.7 04/03/2022    Medications Reviewed Today     Reviewed by Darius Bump, [place] (Pharmacist) on 09/11/22 at 1446  Med List Status: <None>   Medication Order Taking? Sig Documenting Provider Last Dose Status Informant   albuterol (PROVENTIL HFA;VENTOLIN HFA) 108 (90 Base) MCG/ACT inhaler [id number] Yes Inhale 2 puffs into the lungs every 6 (six) hours as needed for wheezing or shortness of breath. Donella Stade, PA-C Taking Active   atorvastatin (LIPITOR) 40 MG tablet [id number] Yes TAKE 1 TABLET EVERY DAY Breeback, Jade L, PA-C Taking Active   cetirizine (ZYRTEC) 10 MG tablet [id number] Yes TAKE 1 TABLET EVERY DAY (APPOINTMENT FOR FURTHER REFILLS) Breeback, Jade L, PA-C Taking Active   famotidine (PEPCID) 20 MG tablet [id number] Yes Take 20 mg by mouth daily. [provider] Taking Active   Fluticasone-Umeclidin-Vilant (TRELEGY ELLIPTA) 100-62.5-25 MCG/ACT AEPB [id number] Yes Inhale 1 puff into the lungs daily. Donella Stade, PA-C Taking Active   hydrOXYzine (ATARAX) 10 MG tablet [id number] Yes Take 1 tablet (10 mg total) by mouth 3 (three) times daily as needed. For anxiety. Donella Stade, PA-C Taking Active   sildenafil (REVATIO) 20 MG tablet [id number] No Take 1-5 tablets (20-100 mg total) by mouth as needed. Breeback, Jade L, PA-C Unknown Active   tamsulosin (FLOMAX) 0.4 MG CAPS capsule [id number] No Take 1 capsule (0.4 mg total) by mouth daily after supper.  Patient not taking: Reported on 09/11/2022   Donella Stade, PA-C Not Taking Active               Assessment/Plan:   Medication Management: - Currently strategy sufficient to maintain appropriate adherence to prescribed medication regimen - Assessed CBP metric: patient has white coat hypertension, well controlled readings at home without taking BP medication. Encouraged patient to  continue home monitoring, provide numbers at upcoming PCP visit to assist with documenting BP control.  - Assessed MAC metric: patient reports on-time refills, taking cholesterol medication daily, lipid panel from 03/2022 also in accordance with patient's report. Recommend to continue current regimen. - Reviewed list of medication and indication with  patient    Follow Up Plan: PRN  Larinda Buttery, PharmD, [facility]

## 2022-09-12 ENCOUNTER — Telehealth: Payer: Self-pay | Admitting: Physician Assistant

## 2022-09-12 NOTE — Telephone Encounter (Signed)
-----   Message from Donella Stade, [place] sent at 07/22/2021  8:07 AM EST ----- Repeat thoracic CT in 6 months.

## 2022-10-06 ENCOUNTER — Ambulatory Visit (INDEPENDENT_AMBULATORY_CARE_PROVIDER_SITE_OTHER): Payer: Medicare HMO | Admitting: Physician Assistant

## 2022-10-06 ENCOUNTER — Encounter: Payer: Self-pay | Admitting: Physician Assistant

## 2022-10-06 VITALS — BP 136/78 | HR 74 | Ht 72.0 in | Wt 161.0 lb

## 2022-10-06 DIAGNOSIS — R351 Nocturia: Secondary | ICD-10-CM

## 2022-10-06 DIAGNOSIS — E119 Type 2 diabetes mellitus without complications: Secondary | ICD-10-CM | POA: Diagnosis not present

## 2022-10-06 DIAGNOSIS — E785 Hyperlipidemia, unspecified: Secondary | ICD-10-CM | POA: Diagnosis not present

## 2022-10-06 DIAGNOSIS — I700 Atherosclerosis of aorta: Secondary | ICD-10-CM

## 2022-10-06 DIAGNOSIS — I10 Essential (primary) hypertension: Secondary | ICD-10-CM

## 2022-10-06 DIAGNOSIS — J441 Chronic obstructive pulmonary disease with (acute) exacerbation: Secondary | ICD-10-CM

## 2022-10-06 DIAGNOSIS — J411 Mucopurulent chronic bronchitis: Secondary | ICD-10-CM

## 2022-10-06 MED ORDER — TRELEGY ELLIPTA 100-62.5-25 MCG/ACT IN AEPB: 1.0000 | INHALATION_SPRAY | Freq: Every day | RESPIRATORY_TRACT | 3 refills | Status: DC

## 2022-10-06 MED ORDER — PREDNISONE 50 MG PO TABS: ORAL_TABLET | ORAL | 0 refills | Status: DC

## 2022-10-06 MED ORDER — TERAZOSIN HCL 1 MG PO CAPS: 1.0000 mg | ORAL_CAPSULE | Freq: Every day | ORAL | 1 refills | Status: DC

## 2022-10-06 NOTE — Patient Instructions (Signed)
Prednisone for 5 days New medication to try for frequent urination

## 2022-10-06 NOTE — Progress Notes (Signed)
Established Patient Office Visit  Subjective   Patient ID: Timothy Perkins, male    DOB: 05/01/1951  Age: 72 y.o. MRN: [id number]  Chief Complaint  Patient presents with   Hypertension    HPI Pt is a 72 yo male with HTN, BPH, COPD, aortic atherosclerosis who presents to the clinic for 6 month follow up.   He is doing pretty well. He is checking BP at home and running 120s to 130s over 70s. He denies any CP or palpitations. He has been more SOB when outside mowing lawns and a little more tired. He uses trelegy daily and rescue inhaler only when he needs it. He continues to smoke but has cut back some. Flomax did not help with urination at night and frequency.   .. Active Ambulatory Problems    Diagnosis Date Noted   Alcohol abuse 01/07/2014   Gastritis and duodenitis 01/08/2014   Anxiety 09/18/2014   COPD exacerbation (HCC) 02/25/2016   Tobacco dependency 02/25/2016   Chronic bronchitis (HCC) 03/08/2016   Essential hypertension, benign 06/16/2016   Gastroesophageal reflux disease with esophagitis 02/22/2018   Seasonal allergies 02/22/2018   Solar lentigo 04/17/2018   Actinic keratosis 04/17/2018   Melanoma of skin (HCC) 04/19/2018   Current smoker 04/23/2020   Hyperlipidemia 05/30/2021   Hyperlipidemia LDL goal <100 06/07/2021   Left-sided chest pain 07/04/2021   Hyperlipidemia associated with type 2 diabetes mellitus (HCC) 07/04/2021   Abnormal EKG 07/04/2021   Abdominal aortic atherosclerosis (HCC) 07/13/2021   Fatty liver 07/21/2021   Neck pain on left side 07/26/2021   Emphysema lung (HCC) 08/20/2021   Aortic atherosclerosis (HCC) 10/03/2021   DDD (degenerative disc disease), cervical 10/03/2021   Gastroesophageal reflux disease 10/04/2021   Right leg pain 11/30/2021   CAD (coronary artery disease) 07/21/2022   Acute URI 07/31/2022   Resolved Ambulatory Problems    Diagnosis Date Noted   Depression 01/07/2014   Past Medical History:  Diagnosis Date   COPD  (chronic obstructive pulmonary disease) (HCC)    Hypertension    Lazy eye of left side      Review of Systems  All other systems reviewed and are negative.     Objective:     BP 136/78   Pulse 74   Ht 6' (1.829 m)   Wt 161 lb (73 kg)   SpO2 100%   BMI 21.84 kg/m  BP Readings from Last 3 Encounters:  10/06/22 136/78  07/31/22 133/67  04/03/22 (!) 152/88   Wt Readings from Last 3 Encounters:  10/06/22 161 lb (73 kg)  07/31/22 164 lb (74.4 kg)  04/03/22 160 lb (72.6 kg)      Physical Exam Constitutional:      Appearance: Normal appearance.  HENT:     Head: Normocephalic.  Cardiovascular:     Rate and Rhythm: Normal rate and regular rhythm.  Pulmonary:     Effort: Pulmonary effort is normal.     Breath sounds: Normal breath sounds. No wheezing.     Comments: Coarse breath sounds Musculoskeletal:     Right lower leg: No edema.     Left lower leg: No edema.  Neurological:     General: No focal deficit present.     Mental Status: He is alert and oriented to person, place, and time.  Psychiatric:        Mood and Affect: Mood normal.        The 10-year ASCVD risk score (Arnett DK, et al., 2019)  is: 47.7%    Assessment & Plan:  Timothy Perkins Timothy Perkins was seen today for hypertension.  Diagnoses and all orders for this visit:  Essential hypertension, benign  Hyperlipidemia LDL goal <100  Aortic atherosclerosis (HCC)  Mucopurulent chronic bronchitis (HCC)  Nocturia -     terazosin (HYTRIN) 1 MG capsule; Take 1 capsule (1 mg total) by mouth at bedtime. For urination at night.  COPD exacerbation (HCC) -     predniSONE (DELTASONE) 50 MG tablet; Take one tablet for 5 days.   BP looks good today and home readings look great Vitals look great Labs UTD Continue on same medications Added terazosin to try for BPH symptoms at bedtime Continue to cut back on smoking-due for lung cancer screening with pulmonology Trelegy to continue and albuterol as needed for  rescue Prednisone burst given for exacerbation  Follow up in 6 months or as needed  Tdap encouraged to get at pharmacy  Return in about 6 months (around 04/07/2023).    Timothy Gaw, PA-C

## 2022-10-18 ENCOUNTER — Ambulatory Visit (INDEPENDENT_AMBULATORY_CARE_PROVIDER_SITE_OTHER): Payer: Medicare HMO | Admitting: Physician Assistant

## 2022-10-18 DIAGNOSIS — Z0000 Encounter for general adult medical examination without abnormal findings: Secondary | ICD-10-CM | POA: Diagnosis not present

## 2022-10-18 NOTE — Progress Notes (Signed)
MEDICARE ANNUAL WELLNESS VISIT  10/18/2022  Telephone Visit Disclaimer This Medicare AWV was conducted by telephone due to national recommendations for restrictions regarding the COVID-19 Pandemic (e.g. social distancing).  I verified, using two identifiers, that I am speaking with Timothy Perkins or their authorized healthcare agent. I discussed the limitations, risks, security, and privacy concerns of performing an evaluation and management service by telephone and the potential availability of an in-person appointment in the future. The patient expressed understanding and agreed to proceed.  Location of Patient: Home Location of Provider (nurse):  In the office.  Subjective:    Timothy Perkins is a 72 y.o. male patient of Timothy Perkins, Timothy Cobb, PA-C who had a Medicare Annual Wellness Visit today via telephone. Timothy Perkins is Retired and lives alone. he has 2 children. he reports that he is socially active and does interact with friends/family regularly. he is moderately physically active and enjoys taking care of the farm, animals and riding motorcyle.  Patient Care Team: Nolene Ebbs as PCP - General (Family Medicine) Gabriel Carina, [facility] as Pharmacist (Pharmacist)     10/18/2022    1:24 PM 10/11/2021    4:06 PM 07/14/2020    9:42 AM 02/05/2019    8:07 AM  Advanced Directives  Does Patient Have a Medical Advance Directive? Yes Yes Yes Yes  Type of Advance Directive Living will Living will Living will Healthcare Power of [place];Living will  Does patient want to make changes to medical advance directive? No - Patient declined No - Patient declined No - Patient declined No - Patient declined  Copy of Healthcare Power of Attorney in Chart?    No - copy requested    Hospital Utilization Over the Past 12 Months: # of hospitalizations or ER visits: 0 # of surgeries: 0  Review of Systems    Patient reports that his overall health is unchanged compared to last year.  History obtained  from chart review and the patient  Patient Reported Readings (BP, Pulse, CBG, Weight, etc) none  Pain Assessment Pain : No/denies pain     Current Medications & Allergies (verified) Allergies as of 10/18/2022       Reactions   Lisinopril    "loopy"        Medication List        Accurate as of Oct 18, 2022  1:39 PM. If you have any questions, ask your nurse or doctor.          STOP taking these medications    predniSONE 50 MG tablet Commonly known as: DELTASONE       TAKE these medications    albuterol 108 (90 Base) MCG/ACT inhaler Commonly known as: VENTOLIN HFA Inhale 2 puffs into the lungs every 6 (six) hours as needed for wheezing or shortness of breath.   aspirin EC 81 MG tablet Take 81 mg by mouth daily. Swallow whole.   atorvastatin 40 MG tablet Commonly known as: LIPITOR TAKE 1 TABLET EVERY DAY   cetirizine 10 MG tablet Commonly known as: ZYRTEC TAKE 1 TABLET EVERY DAY (APPOINTMENT FOR FURTHER REFILLS)   famotidine 20 MG tablet Commonly known as: PEPCID Take 20 mg by mouth daily.   hydrOXYzine 10 MG tablet Commonly known as: ATARAX Take 1 tablet (10 mg total) by mouth 3 (three) times daily as needed. For anxiety.   terazosin 1 MG capsule Commonly known as: HYTRIN Take 1 capsule (1 mg total) by mouth at bedtime. For urination at  night.   Trelegy Ellipta 100-62.5-25 MCG/ACT Aepb Generic drug: Fluticasone-Umeclidin-Vilant Inhale 1 puff into the lungs daily.        History (reviewed): Past Medical History:  Diagnosis Date   COPD (chronic obstructive pulmonary disease) (HCC)    Hypertension    Lazy eye of left side    Past Surgical History:  Procedure Laterality Date   EYE SURGERY Left    Family History  Problem Relation Age of Onset   Cancer Father        Lung/smoker   Kidney disease Sister    Social History   Socioeconomic History   Marital status: Widowed    Spouse name: Not on file   Number of children: 2   Years of  education: 43   Highest education level: 11th grade  Occupational History   Occupation: Music therapist    Comment: retired  Tobacco Use   Smoking status: Every Day    Packs/day: 1.00    Years: 60.00    Additional pack years: 0.00    Total pack years: 60.00    Types: Cigarettes   Smokeless tobacco: Never  Vaping Use   Vaping Use: Never used  Substance and Sexual Activity   Alcohol use: Yes    Alcohol/week: 35.0 standard drinks of alcohol    Types: 35 Cans of beer per week   Drug use: No   Sexual activity: Yes  Other Topics Concern   Not on file  Social History Narrative   Lives alone. Takes care of farm and animals. Likes to ride motorcycle.    Social Determinants of Health   Financial Resource Strain: Low Risk  (10/18/2022)   Overall Financial Resource Strain (CARDIA)    Difficulty of Paying Living Expenses: Not hard at all  Food Insecurity: No Food Insecurity (10/18/2022)   Hunger Vital Sign    Worried About Running Out of Food in the Last Year: Never true    Ran Out of Food in the Last Year: Never true  Transportation Needs: No Transportation Needs (10/18/2022)   PRAPARE - Administrator, Civil Service (Medical): No    Lack of Transportation (Non-Medical): No  Physical Activity: Sufficiently Active (10/18/2022)   Exercise Vital Sign    Days of Exercise per Week: 7 days    Minutes of Exercise per Session: 120 min  Stress: No Stress Concern Present (10/18/2022)   [employer] of Occupational Health - Occupational Stress Questionnaire    Feeling of Stress : Not at all  Social Connections: Moderately Isolated (10/18/2022)   Social Connection and Isolation Panel [NHANES]    Frequency of Communication with Friends and Family: More than three times a week    Frequency of Social Gatherings with Friends and Family: Three times a week    Attends Religious Services: 1 to 4 times per year    Active Member of Clubs or Organizations: No    Attends Banker Meetings:  Never    Marital Status: Widowed    Activities of Daily Living    10/18/2022    1:28 PM  In your present state of health, do you have any difficulty performing the following activities:  Hearing? 1  Comment some hearing loss  Vision? 1  Comment some vision changes  Difficulty concentrating or making decisions? 0  Walking or climbing stairs? 0  Dressing or bathing? 0  Doing errands, shopping? 0  Preparing Food and eating ? N  Using the Toilet? N  In the past  six months, have you accidently leaked urine? N  Do you have problems with loss of bowel control? N  Managing your Medications? N  Managing your Finances? N  Housekeeping or managing your Housekeeping? N    Patient Education/ Literacy How often do you need to have someone help you when you read instructions, pamphlets, or other written materials from your doctor or pharmacy?: 1 - Never What is the last grade level you completed in school?: 11th grade  Exercise Current Exercise Habits: Home exercise routine, Type of exercise: Other - see comments (working on the farm), Time (Minutes): > 60, Frequency (Times/Week): 7, Weekly Exercise (Minutes/Week): 0, Intensity: Moderate, Exercise limited by: None identified  Diet Patient reports consuming 2 meals a day and 2 snack(s) a day Patient reports that his primary diet is: Regular Patient reports that she does have regular access to food.   Depression Screen    10/18/2022    1:25 PM 10/06/2022    8:38 AM 10/11/2021    4:07 PM 07/14/2020    9:50 AM 04/21/2020    9:27 AM 02/05/2019    8:10 AM 12/29/2016   10:03 AM  PHQ 2/9 Scores  PHQ - 2 Score 1 0 0 0 0 0 0  PHQ- 9 Score    0        Fall Risk    10/18/2022    1:25 PM 10/06/2022    8:37 AM 07/31/2022    1:51 PM 10/11/2021    4:07 PM 07/14/2020    9:50 AM  Fall Risk   Falls in the past year? 0 0 0 0 0  Number falls in past yr: 0 0 0 0 0  Injury with Fall? 0 0 0 0 0  Risk for fall due to : No Fall Risks No Fall Risks No Fall Risks  No Fall Risks No Fall Risks  Follow up Falls evaluation completed Falls evaluation completed Falls evaluation completed Falls evaluation completed Falls evaluation completed     Objective:  CLEVELAND ADELSON seemed alert and oriented and he participated appropriately during our telephone visit.  Blood Pressure Weight BMI  BP Readings from Last 3 Encounters:  10/06/22 136/78  07/31/22 133/67  04/03/22 (!) 152/88   Wt Readings from Last 3 Encounters:  10/06/22 161 lb (73 kg)  07/31/22 164 lb (74.4 kg)  04/03/22 160 lb (72.6 kg)   BMI Readings from Last 1 Encounters:  10/06/22 21.84 kg/m    *Unable to obtain current vital signs, weight, and BMI due to telephone visit type  Hearing/Vision  Devarsh did not seem to have difficulty with hearing/understanding during the telephone conversation Reports that he has had a formal eye exam by an eye care professional within the past year Reports that he has not had a formal hearing evaluation within the past year *Unable to fully assess hearing and vision during telephone visit type  Cognitive Function:    10/18/2022    1:31 PM 10/11/2021    4:12 PM 07/14/2020    9:50 AM 02/05/2019    8:12 AM  6CIT Screen  What Year? 0 points 0 points 0 points 0 points  What month? 0 points 0 points 0 points 0 points  What time? 0 points 0 points 0 points 0 points  Count back from 20 0 points 0 points 0 points 0 points  Months in reverse 4 points 4 points 4 points 4 points  Repeat phrase 0 points 0 points 0 points 2 points  Total Score 4 points 4 points 4 points 6 points   (Normal:0-7, Significant for Dysfunction: >8)  Normal Cognitive Function Screening: Yes   Immunization & Health Maintenance Record Immunization History  Administered Date(s) Administered   Covid-19, Mrna,Vaccine(Spikevax)84yrs and older 04/21/2022, 04/21/2022   Fluad Quad(high Dose 65+) 04/21/2020, 03/25/2022, 03/25/2022   Influenza, High Dose Seasonal PF 04/15/2018, 04/30/2019    Influenza,inj,Quad PF,6+ Mos 06/16/2016   Influenza-Unspecified 03/31/2021   Moderna Sars-Covid-2 Vaccination 06/25/2020, 03/31/2021   PNEUMOCOCCAL CONJUGATE-20 04/21/2022, 04/21/2022   Pneumococcal Conjugate-13 06/16/2016   Pneumococcal Polysaccharide-23 04/15/2018   Respiratory Syncytial Virus Vaccine,Recomb Aduvanted(Arexvy) 03/25/2022, 03/25/2022   Zoster Recombinat (Shingrix) 06/12/2021, 12/29/2021    Health Maintenance  Topic Date Due   DTaP/Tdap/Td (1 - Tdap) Never done   OPHTHALMOLOGY EXAM  10/18/2022 (Originally 09/24/1960)   FOOT EXAM  10/19/2022 (Originally 09/24/1960)   HEMOGLOBIN A1C  11/18/2022 (Originally 1951/02/05)   COVID-19 Vaccine (4 - 2023-24 season) 08/16/2023 (Originally 06/16/2022)   Lung Cancer Screening  10/18/2023 (Originally 08/16/2022)   Diabetic kidney evaluation - Urine ACR  04/04/2027 (Originally 09/24/1968)   INFLUENZA VACCINE  01/11/2023   Diabetic kidney evaluation - eGFR measurement  04/04/2023   Medicare Annual Wellness (AWV)  10/18/2023   Pneumonia Vaccine 60+ Years old  Completed   Hepatitis C Screening  Completed   Zoster Vaccines- Shingrix  Completed   HPV VACCINES  Aged Out   Fecal DNA (Cologuard)  Discontinued       Assessment  This is a routine wellness examination for [employer].  Health Maintenance: Due or Overdue Health Maintenance Due  Topic Date Due   DTaP/Tdap/Td (1 - Tdap) Never done    Timothy Perkins does not need a referral for Community Assistance: Care Management:   no Social Work:    no Prescription Assistance:  no Nutrition/Diabetes Education:  no   Plan:  Personalized Goals  Goals Addressed               This Visit's Progress     Patient Stated (pt-stated)        Patient stated that he would like to trade his [employer]. But no other goals.        Personalized Health Maintenance & Screening Recommendations  Td vaccine   Lung Cancer Screening Recommended: yes; declined this year; will complete  next year. (Low Dose CT Chest recommended if Age 22-80 years, 20 pack-year currently smoking OR have quit w/in past 15 years) Hepatitis C Screening recommended: no HIV Screening recommended: no  Advanced Directives: Written information was not prepared per patient's request.  Referrals & Orders No orders of the defined types were placed in this encounter.   Follow-up Plan Follow-up with Jomarie Longs, PA-C as planned Schedule Tetanus vaccine at the pharmacy. Medicare wellness visit in one year.  AVS printed and mailed to the patient.   I have personally reviewed and noted the following in the patient's chart:   Medical and social history Use of alcohol, tobacco or illicit drugs  Current medications and supplements Functional ability and status Nutritional status Physical activity Advanced directives List of other physicians Hospitalizations, surgeries, and ER visits in previous 12 months Vitals Screenings to include cognitive, depression, and falls Referrals and appointments  In addition, I have reviewed and discussed with Timothy Perkins certain preventive protocols, quality metrics, and best practice recommendations. A written personalized care plan for preventive services as well as general preventive health recommendations is available and can be mailed to  the patient at his request.      Modesto Charon, RN BSN   10/18/2022

## 2022-10-18 NOTE — Patient Instructions (Signed)
MEDICARE ANNUAL WELLNESS VISIT Health Maintenance Summary and Written Plan of Care  Timothy Perkins ,  Thank you for allowing me to perform your Medicare Annual Wellness Visit and for your ongoing commitment to your health.   Health Maintenance & Immunization History Health Maintenance  Topic Date Due   DTaP/Tdap/Td (1 - Tdap) Never done   OPHTHALMOLOGY EXAM  10/18/2022 (Originally 09/24/1960)   FOOT EXAM  10/19/2022 (Originally 09/24/1960)   HEMOGLOBIN A1C  11/18/2022 (Originally [birth year])   COVID-19 Vaccine (4 - 2023-24 season) 08/16/2023 (Originally 06/16/2022)   Lung Cancer Screening  10/18/2023 (Originally 08/16/2022)   Diabetic kidney evaluation - Urine ACR  04/04/2027 (Originally 09/24/1968)   INFLUENZA VACCINE  01/11/2023   Diabetic kidney evaluation - eGFR measurement  04/04/2023   Medicare Annual Wellness (AWV)  10/18/2023   Pneumonia Vaccine 65+ Years old  Completed   Hepatitis C Screening  Completed   Zoster Vaccines- Shingrix  Completed   HPV VACCINES  Aged Out   Fecal DNA (Cologuard)  Discontinued   Immunization History  Administered Date(s) Administered   Covid-19, Mrna,Vaccine(Spikevax)47yrs and older 04/21/2022, 04/21/2022   Fluad Quad(high Dose 65+) 04/21/2020, 03/25/2022, 03/25/2022   Influenza, High Dose Seasonal PF 04/15/2018, 04/30/2019   Influenza,inj,Quad PF,6+ Mos 06/16/2016   Influenza-Unspecified 03/31/2021   Moderna Sars-Covid-2 Vaccination 06/25/2020, 03/31/2021   PNEUMOCOCCAL CONJUGATE-20 04/21/2022, 04/21/2022   Pneumococcal Conjugate-13 06/16/2016   Pneumococcal Polysaccharide-23 04/15/2018   Respiratory Syncytial Virus Vaccine,Recomb Aduvanted(Arexvy) 03/25/2022, 03/25/2022   Zoster Recombinat (Shingrix) 06/12/2021, 12/29/2021    These are the patient goals that we discussed:  Goals Addressed               This Visit's Progress     Patient Stated (pt-stated)        Patient stated that he would like to trade his [employer]. But no other  goals.          This is a list of Health Maintenance Items that are overdue or due now: Health Maintenance Due  Topic Date Due   DTaP/Tdap/Td (1 - Tdap) Never done     Orders/Referrals Placed Today: No orders of the defined types were placed in this encounter.  (Contact our referral department at [phone] if you have not spoken with someone about your referral appointment within the next 5 days)    Follow-up Plan Follow-up with Jomarie Longs, PA-C as planned Schedule Tetanus vaccine at the pharmacy. Medicare wellness visit in one year.  AVS printed and mailed to the patient.      Health Maintenance, Male Adopting a healthy lifestyle and getting preventive care are important in promoting health and wellness. Ask your health care provider about: The right schedule for you to have regular tests and exams. Things you can do on your own to prevent diseases and keep yourself healthy. What should I know about diet, weight, and exercise? Eat a healthy diet  Eat a diet that includes plenty of vegetables, fruits, low-fat dairy products, and lean protein. Do not eat a lot of foods that are high in solid fats, added sugars, or sodium. Maintain a healthy weight Body mass index (BMI) is a measurement that can be used to identify possible weight problems. It estimates body fat based on height and weight. Your health care provider can help determine your BMI and help you achieve or maintain a healthy weight. Get regular exercise Get regular exercise. This is one of the most important things you can do for your health. Most adults  should: Exercise for at least 150 minutes each week. The exercise should increase your heart rate and make you sweat (moderate-intensity exercise). Do strengthening exercises at least twice a week. This is in addition to the moderate-intensity exercise. Spend less time sitting. Even light physical activity can be beneficial. Watch cholesterol and blood  lipids Have your blood tested for lipids and cholesterol at 74 years of age, then have this test every 5 years. You may need to have your cholesterol levels checked more often if: Your lipid or cholesterol levels are high. You are older than 74 years of age. You are at high risk for heart disease. What should I know about cancer screening? Many types of cancers can be detected early and may often be prevented. Depending on your health history and family history, you may need to have cancer screening at various ages. This may include screening for: Colorectal cancer. Prostate cancer. Skin cancer. Lung cancer. What should I know about heart disease, diabetes, and high blood pressure? Blood pressure and heart disease High blood pressure causes heart disease and increases the risk of stroke. This is more likely to develop in people who have high blood pressure readings or are overweight. Talk with your health care provider about your target blood pressure readings. Have your blood pressure checked: Every 3-5 years if you are 52-34 years of age. Every year if you are 14 years old or older. If you are between the ages of 69 and 25 and are a current or former smoker, ask your health care provider if you should have a one-time screening for abdominal aortic aneurysm (AAA). Diabetes Have regular diabetes screenings. This checks your fasting blood sugar level. Have the screening done: Once every three years after age 67 if you are at a normal weight and have a low risk for diabetes. More often and at a younger age if you are overweight or have a high risk for diabetes. What should I know about preventing infection? Hepatitis B If you have a higher risk for hepatitis B, you should be screened for this virus. Talk with your health care provider to find out if you are at risk for hepatitis B infection. Hepatitis C Blood testing is recommended for: Everyone born from [birth year] through 1967. Anyone with  known risk factors for hepatitis C. Sexually transmitted infections (STIs) You should be screened each year for STIs, including gonorrhea and chlamydia, if: You are sexually active and are younger than 74 years of age. You are older than 74 years of age and your health care provider tells you that you are at risk for this type of infection. Your sexual activity has changed since you were last screened, and you are at increased risk for chlamydia or gonorrhea. Ask your health care provider if you are at risk. Ask your health care provider about whether you are at high risk for HIV. Your health care provider may recommend a prescription medicine to help prevent HIV infection. If you choose to take medicine to prevent HIV, you should first get tested for HIV. You should then be tested every 3 months for as long as you are taking the medicine. Follow these instructions at home: Alcohol use Do not drink alcohol if your health care provider tells you not to drink. If you drink alcohol: Limit how much you have to 0-2 drinks a day. Know how much alcohol is in your drink. In the [place] one drink equals one 12 oz bottle of beer (355 mL),  one 5 oz glass of wine (148 mL), or one 1 oz glass of hard liquor (44 mL). Lifestyle Do not use any products that contain nicotine or tobacco. These products include cigarettes, chewing tobacco, and vaping devices, such as e-cigarettes. If you need help quitting, ask your health care provider. Do not use street drugs. Do not share needles. Ask your health care provider for help if you need support or information about quitting drugs. General instructions Schedule regular health, dental, and eye exams. Stay current with your vaccines. Tell your health care provider if: You often feel depressed. You have ever been abused or do not feel safe at home. Summary Adopting a healthy lifestyle and getting preventive care are important in promoting health and wellness. Follow  your health care provider's instructions about healthy diet, exercising, and getting tested or screened for diseases. Follow your health care provider's instructions on monitoring your cholesterol and blood pressure. This information is not intended to replace advice given to you by your health care provider. Make sure you discuss any questions you have with your health care provider. Document Revised: 10/18/2020 Document Reviewed: 10/18/2020 Elsevier Patient Education  2023 [employer].

## 2022-11-03 ENCOUNTER — Encounter: Payer: Self-pay | Admitting: Physician Assistant

## 2022-11-03 MED ORDER — ATORVASTATIN CALCIUM 40 MG PO TABS: 40.0000 mg | ORAL_TABLET | Freq: Every day | ORAL | 3 refills | Status: DC

## 2022-11-09 ENCOUNTER — Other Ambulatory Visit: Payer: Self-pay | Admitting: Physician Assistant

## 2022-11-09 DIAGNOSIS — J302 Other seasonal allergic rhinitis: Secondary | ICD-10-CM

## 2022-11-23 DIAGNOSIS — H25813 Combined forms of age-related cataract, bilateral: Secondary | ICD-10-CM | POA: Diagnosis not present

## 2022-11-23 DIAGNOSIS — Z135 Encounter for screening for eye and ear disorders: Secondary | ICD-10-CM | POA: Diagnosis not present

## 2022-11-23 DIAGNOSIS — H524 Presbyopia: Secondary | ICD-10-CM | POA: Diagnosis not present

## 2022-11-23 DIAGNOSIS — H353130 Nonexudative age-related macular degeneration, bilateral, stage unspecified: Secondary | ICD-10-CM | POA: Diagnosis not present

## 2022-11-23 DIAGNOSIS — H5231 Anisometropia: Secondary | ICD-10-CM | POA: Diagnosis not present

## 2023-01-02 ENCOUNTER — Other Ambulatory Visit: Payer: Self-pay | Admitting: Physician Assistant

## 2023-01-23 ENCOUNTER — Encounter: Payer: Self-pay | Admitting: Physician Assistant

## 2023-01-24 NOTE — Telephone Encounter (Signed)
Can we call and see if we can get a free sample dropped off, please.

## 2023-02-26 DIAGNOSIS — H25813 Combined forms of age-related cataract, bilateral: Secondary | ICD-10-CM | POA: Diagnosis not present

## 2023-02-26 DIAGNOSIS — H353131 Nonexudative age-related macular degeneration, bilateral, early dry stage: Secondary | ICD-10-CM | POA: Diagnosis not present

## 2023-02-26 DIAGNOSIS — H02834 Dermatochalasis of left upper eyelid: Secondary | ICD-10-CM | POA: Diagnosis not present

## 2023-02-26 DIAGNOSIS — H02831 Dermatochalasis of right upper eyelid: Secondary | ICD-10-CM | POA: Diagnosis not present

## 2023-02-26 DIAGNOSIS — H52223 Regular astigmatism, bilateral: Secondary | ICD-10-CM | POA: Diagnosis not present

## 2023-02-26 DIAGNOSIS — H524 Presbyopia: Secondary | ICD-10-CM | POA: Diagnosis not present

## 2023-02-26 DIAGNOSIS — H40023 Open angle with borderline findings, high risk, bilateral: Secondary | ICD-10-CM | POA: Diagnosis not present

## 2023-03-07 ENCOUNTER — Other Ambulatory Visit: Payer: Self-pay | Admitting: Physician Assistant

## 2023-03-07 DIAGNOSIS — F419 Anxiety disorder, unspecified: Secondary | ICD-10-CM

## 2023-03-07 IMAGING — DX DG CERVICAL SPINE COMPLETE 4+V
6 series · 6 of 6 positions shown · non-contrast
Comparison: CT chest 07/18/2021.

CLINICAL DATA: History of neck pain.  No known injury.

EXAM:
CERVICAL SPINE - COMPLETE 4+ VIEW

[c-spine lat]
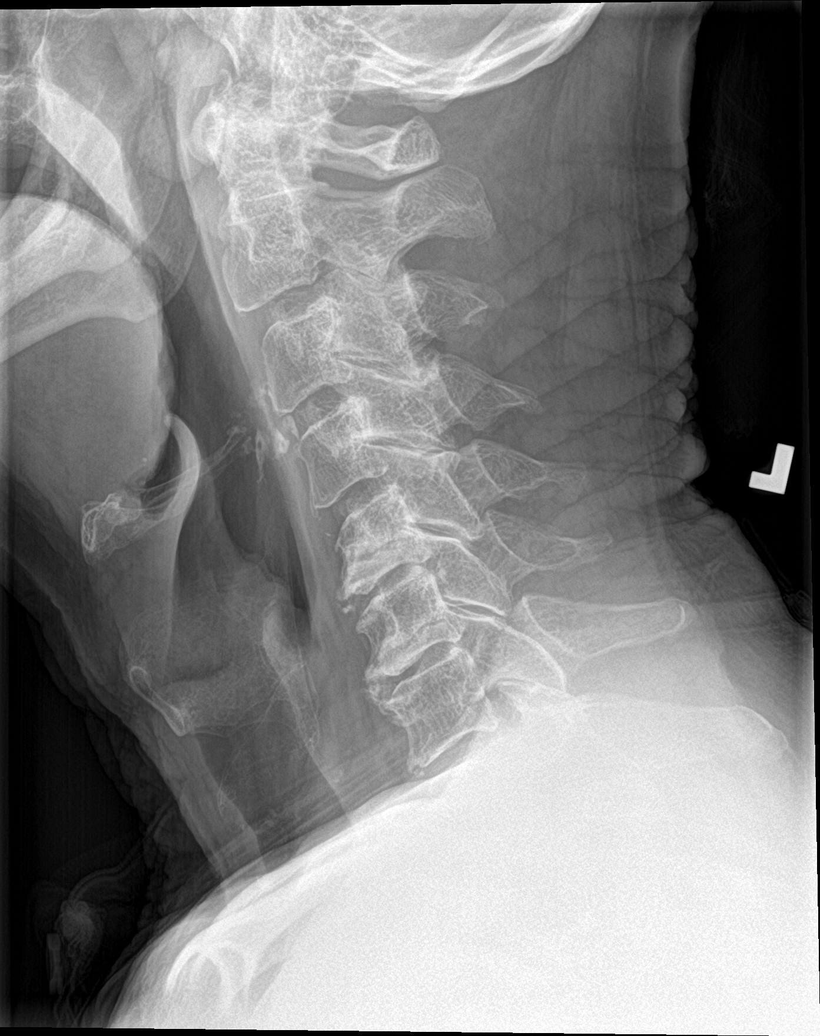

[c-spine obl (1 of 2)]
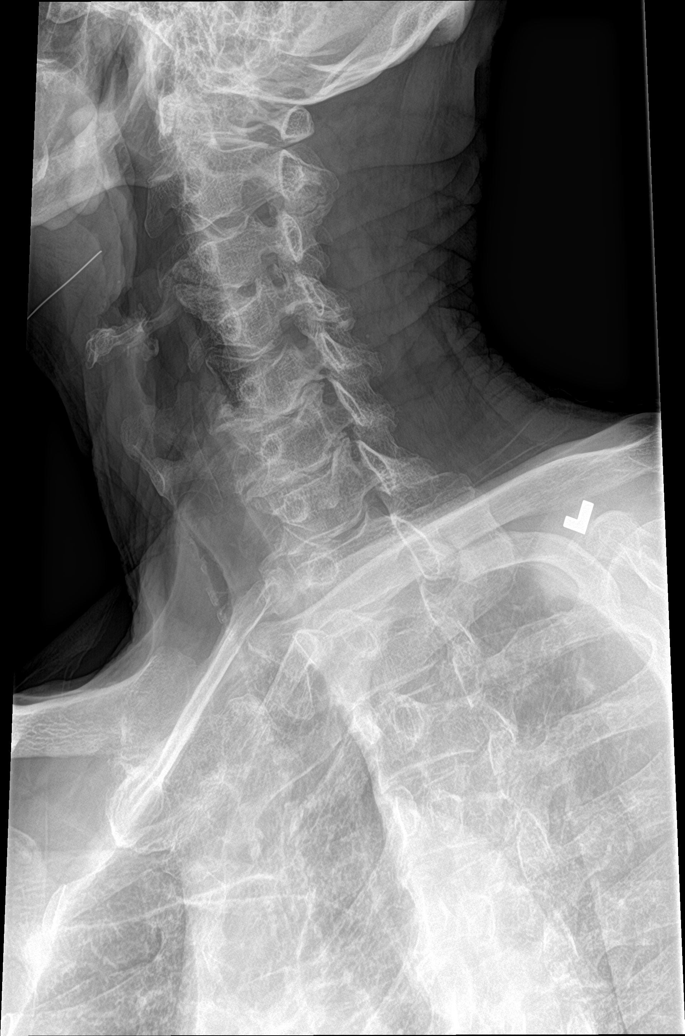

[c-spine obl (2 of 2)]
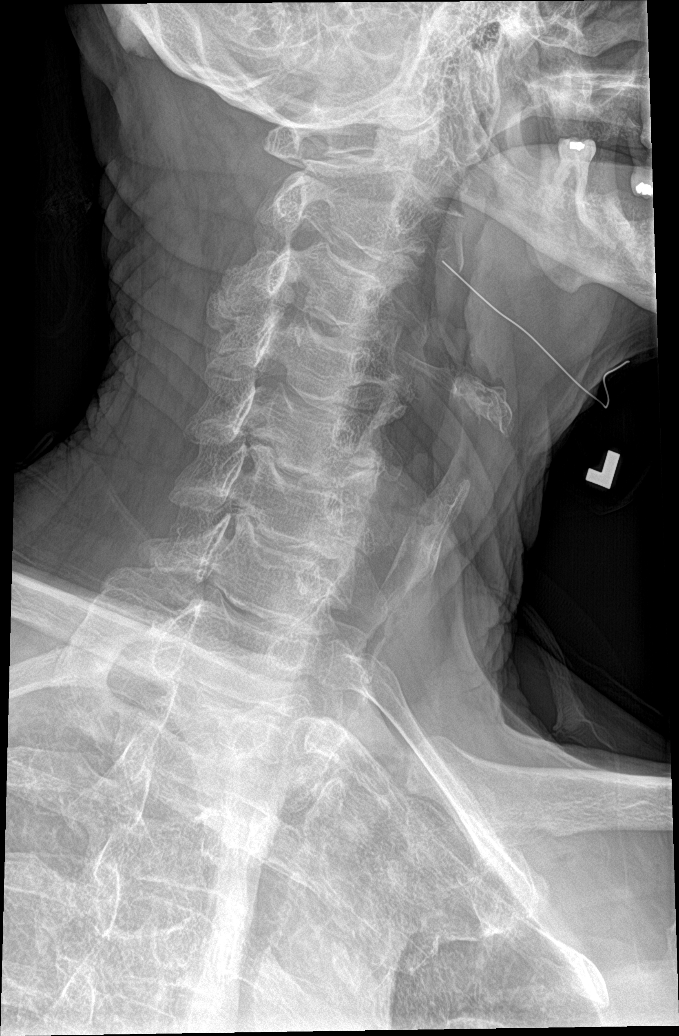

[c-spine ap]
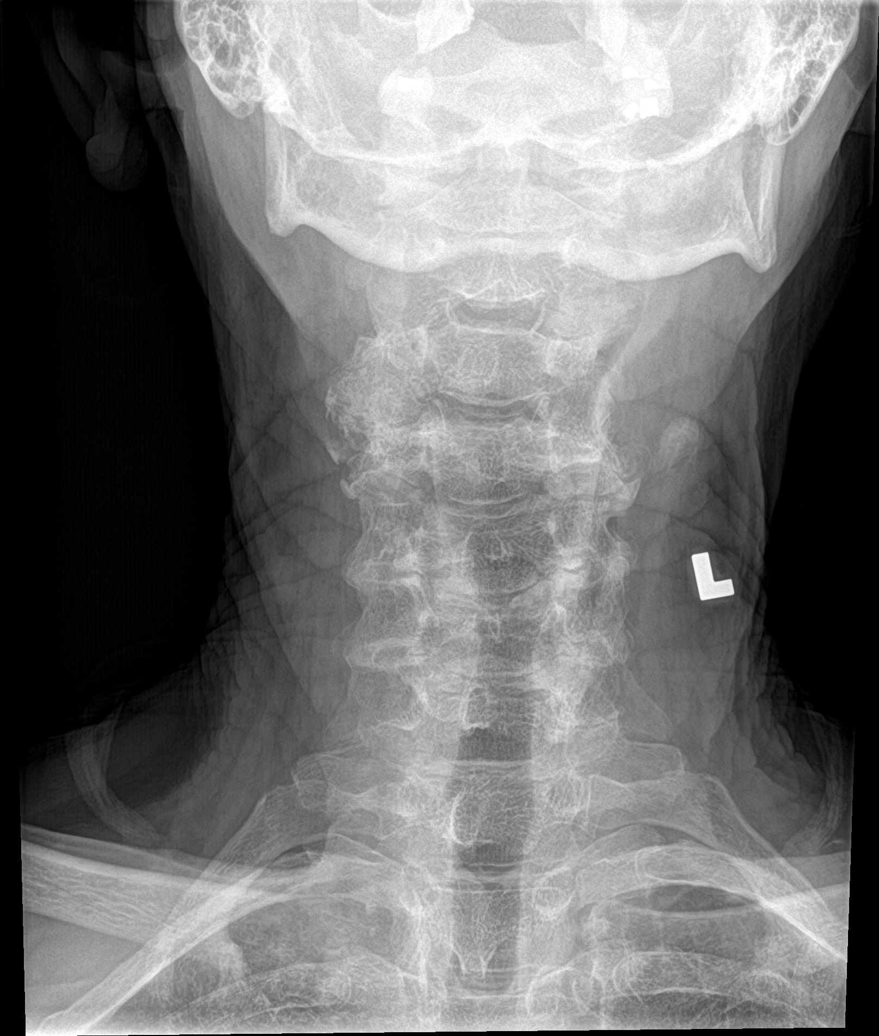

[c-spine open mouth]
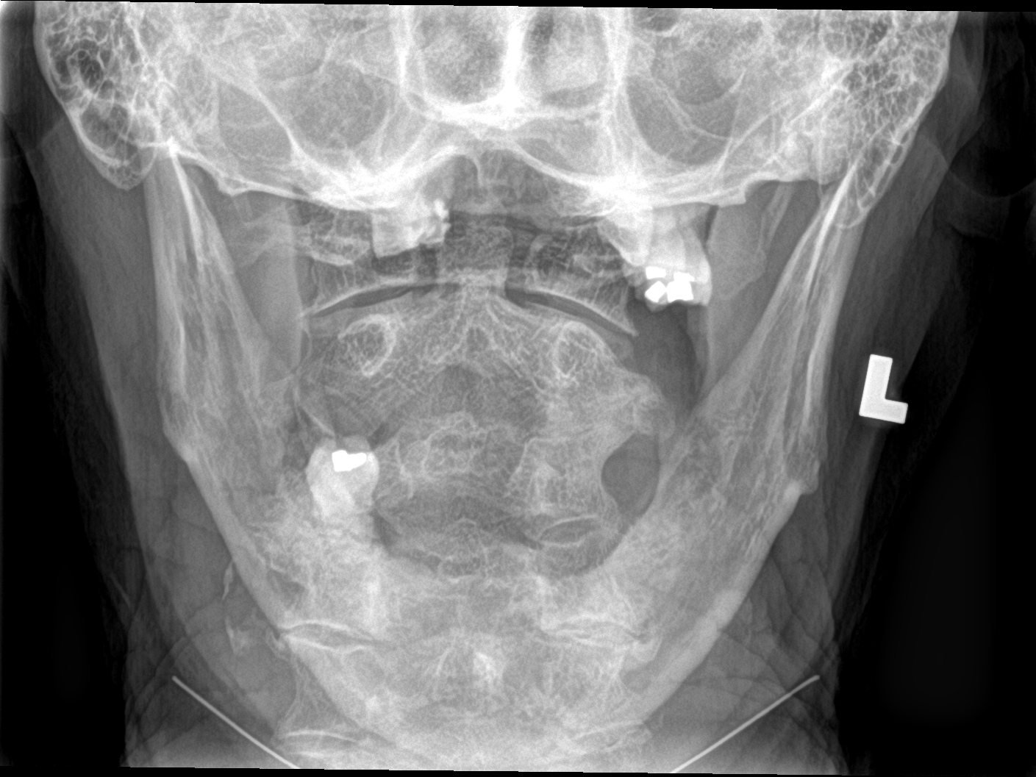

[c-spine swimmers]
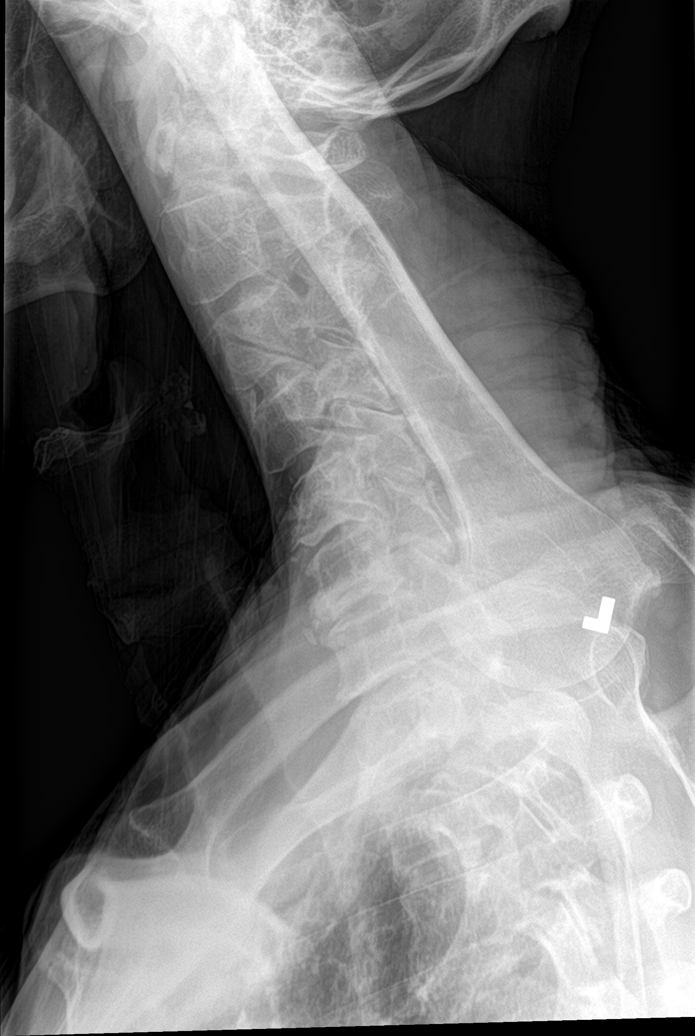

[6 of 6 positions shown; findings below may reference images not displayed]

FINDINGS: Diffuse multilevel degenerative change with disc space loss and
endplate osteophyte formation most prominent at C5-C6 and C6-C7.
Mild diffuse facet hypertrophy. No acute bony abnormality. No
evidence of fracture dislocation. Multilevel bilateral mild to
moderate neural foraminal narrowing. Carotid vascular calcification.
Pulmonary apices are clear.
IMPRESSION: 1. Diffuse multilevel degenerative change. Degenerative changes most
prominent at C5-C6 and C6-C7. Multilevel bilateral mild to moderate
bilateral neural foraminal narrowing. No acute bony abnormality
identified.

2.  Carotid vascular disease.

## 2023-04-09 ENCOUNTER — Ambulatory Visit (INDEPENDENT_AMBULATORY_CARE_PROVIDER_SITE_OTHER): Payer: Medicare HMO | Admitting: Physician Assistant

## 2023-04-09 ENCOUNTER — Encounter: Payer: Self-pay | Admitting: Physician Assistant

## 2023-04-09 VITALS — BP 153/71 | HR 66 | Ht 72.0 in | Wt 164.0 lb

## 2023-04-09 DIAGNOSIS — E782 Mixed hyperlipidemia: Secondary | ICD-10-CM | POA: Diagnosis not present

## 2023-04-09 DIAGNOSIS — R351 Nocturia: Secondary | ICD-10-CM | POA: Diagnosis not present

## 2023-04-09 DIAGNOSIS — I10 Essential (primary) hypertension: Secondary | ICD-10-CM

## 2023-04-09 DIAGNOSIS — J411 Mucopurulent chronic bronchitis: Secondary | ICD-10-CM

## 2023-04-09 DIAGNOSIS — L578 Other skin changes due to chronic exposure to nonionizing radiation: Secondary | ICD-10-CM | POA: Diagnosis not present

## 2023-04-09 DIAGNOSIS — J441 Chronic obstructive pulmonary disease with (acute) exacerbation: Secondary | ICD-10-CM | POA: Diagnosis not present

## 2023-04-09 DIAGNOSIS — R7301 Impaired fasting glucose: Secondary | ICD-10-CM | POA: Diagnosis not present

## 2023-04-09 DIAGNOSIS — I700 Atherosclerosis of aorta: Secondary | ICD-10-CM | POA: Diagnosis not present

## 2023-04-09 LAB — POCT GLYCOSYLATED HEMOGLOBIN (HGB A1C): Hemoglobin A1C: 5 % — ABNORMAL HIGH (ref 4.0–5.6)

## 2023-04-09 MED ORDER — TAMSULOSIN HCL 0.4 MG PO CAPS
0.4000 mg | ORAL_CAPSULE | Freq: Every day | ORAL | 1 refills | Status: DC
Start: 2023-04-09 — End: 2024-04-09

## 2023-04-09 NOTE — Progress Notes (Signed)
Established Patient Office Visit  Subjective   Patient ID: Timothy Perkins, male    DOB: [birth year]  Age: 72 y.o. MRN: [id number]  Chief Complaint  Patient presents with   Medical Management of Chronic Issues    HPI  Timothy Perkins is a 72 year old male with HTN, BPH, COPD who presents to the clinic for follow up. He states he is doing well and is taking his Trelegy and Lipitor once a day. He states he barely has to use his albuterol inhaler and never really has a cough. He is still smoking about a pack per day and continues to try to cut down. He hopes to one day quit altogether. He states he takes his blood pressure at home and it runs 120-130s over 70s. He denies CP/SOB/palpitations. He will be getting his flu, COVID, and Tdap vaccination at [facility].   He states he continues to wake up in the night to go to the bathroom. He tried Hytrin but still was getting up in the night so he no longer takes it.   .. Active Ambulatory Problems    Diagnosis Date Noted   Alcohol abuse 01/07/2014   Gastritis and duodenitis 01/08/2014   Anxiety 09/18/2014   COPD exacerbation (HCC) 02/25/2016   Tobacco dependency 02/25/2016   Chronic bronchitis (HCC) 03/08/2016   Essential hypertension, benign 06/16/2016   Gastroesophageal reflux disease with esophagitis 02/22/2018   Seasonal allergies 02/22/2018   Solar lentigo 04/17/2018   Actinic keratosis 04/17/2018   Melanoma of skin (HCC) 04/19/2018   Current smoker 04/23/2020   Hyperlipidemia 05/30/2021   Hyperlipidemia LDL goal <100 06/07/2021   Left-sided chest pain 07/04/2021   Hyperlipidemia associated with type 2 diabetes mellitus (HCC) 07/04/2021   Abnormal EKG 07/04/2021   Abdominal aortic atherosclerosis (HCC) 07/13/2021   Fatty liver 07/21/2021   Neck pain on left side 07/26/2021   Emphysema lung (HCC) 08/20/2021   Aortic atherosclerosis (HCC) 10/03/2021   DDD (degenerative disc disease), cervical 10/03/2021   Gastroesophageal reflux  disease 10/04/2021   Right leg pain 11/30/2021   CAD (coronary artery disease) 07/21/2022   Acute URI 07/31/2022   Nocturia 04/09/2023   Elevated fasting glucose 04/09/2023   Resolved Ambulatory Problems    Diagnosis Date Noted   Depression 01/07/2014   Past Medical History:  Diagnosis Date   COPD (chronic obstructive pulmonary disease) (HCC)    Hypertension    Lazy eye of left side      ROS See HPI   Objective:     BP (!) 153/71   Pulse 66   Ht 6' (1.829 m)   Wt 164 lb (74.4 kg)   SpO2 99%   BMI 22.24 kg/m  BP Readings from Last 3 Encounters:  04/09/23 (!) 153/71  10/06/22 136/78  07/31/22 133/67   Wt Readings from Last 3 Encounters:  04/09/23 164 lb (74.4 kg)  10/06/22 161 lb (73 kg)  07/31/22 164 lb (74.4 kg)      Physical Exam Constitutional:      Appearance: Normal appearance.  HENT:     Head: Normocephalic and atraumatic.  Cardiovascular:     Rate and Rhythm: Normal rate and regular rhythm.     Pulses: Normal pulses.     Heart sounds: Normal heart sounds.  Pulmonary:     Effort: Pulmonary effort is normal.     Breath sounds: Normal breath sounds.  Musculoskeletal:        General: Normal range of motion.  Neurological:  General: No focal deficit present.     Mental Status: He is alert and oriented to person, place, and time.  Psychiatric:        Mood and Affect: Mood normal.        Behavior: Behavior normal.      Results for orders placed or performed in visit on 04/09/23  POCT HgB A1C  Result Value Ref Range   Hemoglobin A1C 5.0 (H) 4.0 - 5.6 %   HbA1c POC (<> result, manual entry)     HbA1c, POC (prediabetic range)     HbA1c, POC (controlled diabetic range)      The 10-year ASCVD risk score (Arnett DK, et al., 2019) is: 49.2%    Assessment & Plan:  .Saddiq was seen today for medical management of chronic issues.  Diagnoses and all orders for this visit:  Essential hypertension, benign -     CMP14+EGFR -     CBC  w/Diff/Platelet  Mixed hyperlipidemia -     Lipid panel  COPD exacerbation (HCC)  Mucopurulent chronic bronchitis (HCC)  Aortic atherosclerosis (HCC) -     CBC w/Diff/Platelet  Nocturia -     PSA -     tamsulosin (FLOMAX) 0.4 MG CAPS capsule; Take 1 capsule (0.4 mg total) by mouth daily after supper.  Elevated fasting glucose -     POCT HgB A1C     Patient states he will get COVID/flu/Tdap vaccination at [facility]. Denies AAA and lung cancer screening. He is aware of the risks. A1C is 5% today. Patient doing well. Continue on Trelegy daily and albuterol inhaler as needed. CBC/CMP/lipid panel/PSA ordered. Nocturia still persistent. Try Flomax for 3 months.  Elevated BP today. Patient states at home it is running 120-130s over 70s. Continue to check at home. Declines any blood pressure medication.  Patient smoking about a pack per day. He continues to try to cut back in hopes of quitting altogether.    Return in about 6 months (around 10/08/2023).    Tandy Gaw, PA-C

## 2023-04-09 NOTE — Patient Instructions (Addendum)
Flu/covid/Tdap  Trial of flomax for next 3 months.

## 2023-04-10 LAB — CMP14+EGFR
ALT: 23 [IU]/L (ref 0–44)
AST: 21 [IU]/L (ref 0–40)
Albumin: 4.3 g/dL (ref 3.8–4.8)
Alkaline Phosphatase: 135 [IU]/L — ABNORMAL HIGH (ref 44–121)
BUN/Creatinine Ratio: 11 (ref 10–24)
BUN: 10 mg/dL (ref 8–27)
Bilirubin Total: 0.6 mg/dL (ref 0.0–1.2)
CO2: 23 mmol/L (ref 20–29)
Calcium: 9.2 mg/dL (ref 8.6–10.2)
Chloride: 101 mmol/L (ref 96–106)
Creatinine, Ser: 0.87 mg/dL (ref 0.76–1.27)
Globulin, Total: 2.1 g/dL (ref 1.5–4.5)
Glucose: 97 mg/dL (ref 70–99)
Potassium: 4.8 mmol/L (ref 3.5–5.2)
Sodium: 137 mmol/L (ref 134–144)
Total Protein: 6.4 g/dL (ref 6.0–8.5)
eGFR: 91 mL/min/{1.73_m2} (ref >59)

## 2023-04-10 LAB — LIPID PANEL
Chol/HDL Ratio: 2.7 ratio (ref 0.0–5.0)
Cholesterol, Total: 131 mg/dL (ref 100–199)
HDL: 49 mg/dL (ref >39)
LDL Chol Calc (NIH): 62 mg/dL (ref 0–99)
Triglycerides: 107 mg/dL (ref 0–149)
VLDL Cholesterol Cal: 20 mg/dL (ref 5–40)

## 2023-04-10 LAB — CBC WITH DIFFERENTIAL/PLATELET
Basophils Absolute: 0.1 10*3/uL (ref 0.0–0.2)
Basos: 1 %
EOS (ABSOLUTE): 0.3 10*3/uL (ref 0.0–0.4)
Eos: 3 %
Hematocrit: 45.1 % (ref 37.5–51.0)
Hemoglobin: 14.7 g/dL (ref 13.0–17.7)
Immature Grans (Abs): 0 10*3/uL (ref 0.0–0.1)
Immature Granulocytes: 0 %
Lymphocytes Absolute: 2 10*3/uL (ref 0.7–3.1)
Lymphs: 25 %
MCH: 32.4 pg (ref 26.6–33.0)
MCHC: 32.6 g/dL (ref 31.5–35.7)
MCV: 99 fL — ABNORMAL HIGH (ref 79–97)
Monocytes Absolute: 0.6 10*3/uL (ref 0.1–0.9)
Monocytes: 8 %
Neutrophils Absolute: 5 10*3/uL (ref 1.4–7.0)
Neutrophils: 63 %
Platelets: 209 10*3/uL (ref 150–450)
RBC: 4.54 x10E6/uL (ref 4.14–5.80)
RDW: 11.9 % (ref 11.6–15.4)
WBC: 7.9 10*3/uL (ref 3.4–10.8)

## 2023-04-10 LAB — PSA: Prostate Specific Ag, Serum: 0.8 ng/mL (ref 0.0–4.0)

## 2023-04-10 NOTE — Progress Notes (Signed)
Marquies,   Kidney, glucose and liver look good.  Cholesterol looks much better! Prostate lab looks great.

## 2023-04-16 DIAGNOSIS — L578 Other skin changes due to chronic exposure to nonionizing radiation: Secondary | ICD-10-CM | POA: Insufficient documentation

## 2023-04-16 NOTE — Addendum Note (Signed)
Addended by: Jomarie Longs on: 04/16/2023 07:40 AM   Modules accepted: Orders

## 2023-06-21 ENCOUNTER — Encounter: Payer: Self-pay | Admitting: Acute Care

## 2023-07-18 ENCOUNTER — Encounter: Payer: Self-pay | Admitting: Physician Assistant

## 2023-07-18 ENCOUNTER — Ambulatory Visit (INDEPENDENT_AMBULATORY_CARE_PROVIDER_SITE_OTHER): Payer: Medicare HMO | Admitting: Physician Assistant

## 2023-07-18 VITALS — BP 153/75 | HR 75 | Temp 98.0°F | Ht 72.0 in | Wt 166.0 lb

## 2023-07-18 DIAGNOSIS — J441 Chronic obstructive pulmonary disease with (acute) exacerbation: Secondary | ICD-10-CM | POA: Diagnosis not present

## 2023-07-18 MED ORDER — AZITHROMYCIN 250 MG PO TABS: ORAL_TABLET | ORAL | 0 refills | Status: DC

## 2023-07-18 MED ORDER — METHYLPREDNISOLONE 4 MG PO TBPK: ORAL_TABLET | ORAL | 0 refills | Status: DC

## 2023-07-18 NOTE — Progress Notes (Signed)
 Acute Office Visit  Subjective:     Patient ID: Timothy Perkins, male    DOB: 11/03/1950, 73 y.o.   MRN: [id number]  Chief Complaint  Patient presents with   Cough    HPI Patient is in today for cough, congestion, SOB and chest tightness that persist but wax and wane for the last 3 weeks. He has had a cold and got better but then his girlfriend got sick and he feels like he might be getting sick again. Pt has COPD and taking trelegy daily. He felt like he had a fever last night. Cough is productive. Using albuterol  about once a week.   .. Active Ambulatory Problems    Diagnosis Date Noted   Alcohol abuse 01/07/2014   Gastritis and duodenitis 01/08/2014   Anxiety 09/18/2014   COPD exacerbation (HCC) 02/25/2016   Tobacco dependency 02/25/2016   Chronic bronchitis (HCC) 03/08/2016   Essential hypertension, benign 06/16/2016   Gastroesophageal reflux disease with esophagitis 02/22/2018   Seasonal allergies 02/22/2018   Solar lentigo 04/17/2018   Actinic keratosis 04/17/2018   Melanoma of skin (HCC) 04/19/2018   Current smoker 04/23/2020   Hyperlipidemia 05/30/2021   Hyperlipidemia LDL goal <100 06/07/2021   Left-sided chest pain 07/04/2021   Hyperlipidemia associated with type 2 diabetes mellitus (HCC) 07/04/2021   Abnormal EKG 07/04/2021   Abdominal aortic atherosclerosis (HCC) 07/13/2021   Fatty liver 07/21/2021   Neck pain on left side 07/26/2021   Emphysema lung (HCC) 08/20/2021   Aortic atherosclerosis (HCC) 10/03/2021   DDD (degenerative disc disease), cervical 10/03/2021   Gastroesophageal reflux disease 10/04/2021   Right leg pain 11/30/2021   CAD (coronary artery disease) 07/21/2022   Acute URI 07/31/2022   Nocturia 04/09/2023   Elevated fasting glucose 04/09/2023   Solar aging of skin 04/16/2023   Resolved Ambulatory Problems    Diagnosis Date Noted   Depression 01/07/2014   Past Medical History:  Diagnosis Date   COPD (chronic obstructive pulmonary  disease) (HCC)    Hypertension    Lazy eye of left side      Review of Systems  All other systems reviewed and are negative.       Objective:    BP (!) 153/75   Pulse 75   Temp 98 F (36.7 C) (Oral)   Ht 6' (1.829 m)   Wt 166 lb (75.3 kg)   SpO2 99%   BMI 22.51 kg/m  BP Readings from Last 3 Encounters:  07/18/23 (!) 153/75  04/09/23 (!) 153/71  10/06/22 136/78   Wt Readings from Last 3 Encounters:  07/18/23 166 lb (75.3 kg)  04/09/23 164 lb (74.4 kg)  10/06/22 161 lb (73 kg)      Physical Exam Constitutional:      Appearance: Normal appearance.  HENT:     Head: Normocephalic.     Nose: Nose normal.     Mouth/Throat:     Mouth: Mucous membranes are moist.  Eyes:     Conjunctiva/sclera: Conjunctivae normal.  Cardiovascular:     Rate and Rhythm: Normal rate and regular rhythm.  Pulmonary:     Effort: Pulmonary effort is normal.     Breath sounds: Normal breath sounds. No wheezing or rhonchi.     Comments: Coarse breath sounds Musculoskeletal:     Cervical back: Normal range of motion and neck supple. No tenderness.  Lymphadenopathy:     Cervical: No cervical adenopathy.  Neurological:     General: No focal deficit present.  Mental Status: He is alert and oriented to person, place, and time.  Psychiatric:        Mood and Affect: Mood normal.          Assessment & Plan:  SABRASABRAArav was seen today for cough.  Diagnoses and all orders for this visit:  COPD exacerbation (HCC) -     methylPREDNISolone  (MEDROL  DOSEPAK) 4 MG TBPK tablet; Take as directed by package insert. -     azithromycin  (ZITHROMAX  Z-PAK) 250 MG tablet; Take 2 tablets (500 mg) on  Day 1,  followed by 1 tablet (250 mg) once daily on Days 2 through 5.  Treated for COPD exacerbation  Ok to use albuterol  more during exacerbation Zpak and medrol  dose pack given Rest and hydrate Ok to take mucinex twice a day to loosen sputum Follow up as needed if symptoms persist or  worsen  Vermell Bologna, PA-C

## 2023-07-18 NOTE — Patient Instructions (Signed)
 Chronic Obstructive Pulmonary Disease Exacerbation  Chronic obstructive pulmonary disease (COPD) is a long-term (chronic) lung problem. When you have COPD, it can feel harder to breathe in or out. COPD exacerbation is a flare-up of symptoms when breathing gets worse and more treatment may be needed. Without treatment, flare-ups can be life-threatening. If they happen often, your lungs can become more damaged. What are the causes? Not taking your usual COPD medicines as told by your health care provider. A cold or the flu, which can cause infection in your lungs. Being exposed to things that make your breathing worse, such as: Smoke. Air pollution. Fumes. Dust. Allergies. Weather changes. What are the signs or symptoms? Symptoms do not get better or get worse even if you take your medicines as told by your provider. Symptoms may include: More shortness of breath. You may only be able to speak one or two words at a time. More coughing or mucus from your lungs. More wheezing or chest tightness. Being more tired and having less energy. Confusion. How is this diagnosed? This condition is diagnosed based on: Symptoms that get worse. Your medical history. A physical exam. You may also have tests, including: A chest X-ray. Blood or mucus tests. How is this treated? You may be able to stay home or you may need to go to the hospital. Treatment may include: Taking medicines. These may include: Inhalers. These have medicines in them that you breathe in. These may be more of what you already take or they may be new. Steroids. These reduce inflammation in the airways. These may be inhaled, taken by mouth, or given in an IV. Antibiotics. These treat infection. Using oxygen. Using a device to help you clear mucus. Follow these instructions at home: Medicines Take your medicines only as told by your provider. If you were given antibiotics or steroids, take them as told by your provider. Do  not stop taking them even if you start to feel better. Lifestyle Several times a day, wash your hands with soap and water for at least 20 seconds. If you cannot use soap and water, use hand sanitizer. This may help keep you from getting an infection. Avoid being around crowds or people who are sick. Do not smoke or use any products that contain nicotine or tobacco. If you need help quitting, ask your provider. Return to your normal activities when your provider says that it's safe. Use breathing methods to control your stress and catch your breath. How is this prevented? Follow your COPD action plan. The action plan tells you what to do if you're feeling good and what to do when you start feeling worse. Discuss the plan often with your provider. Make sure you get all the shots, also called vaccines, that your provider recommends. Ask your provider about a flu shot and a pneumonia shot. Use oxygen therapy if told by your provider. If you need home oxygen therapy, ask your provider how often to check your oxygen level with a device called an oximeter. Keep all follow-up visits to review your COPD action plan. Your provider will want to check on your condition often to keep you healthy and out of the hospital. Contact a health care provider if: Your COPD symptoms get worse. You have a fever or chills. You have trouble doing daily activities. You have trouble breathing even when you are resting. Get help right away if: You are short of breath and cannot: Talk in full sentences. Do normal activities. You have chest  pain. You feel confused. These symptoms may be an emergency. Call 911 right away. Do not wait to see if the symptoms will go away. Do not drive yourself to the hospital. This information is not intended to replace advice given to you by your health care provider. Make sure you discuss any questions you have with your health care provider. Document Revised: 03/01/2023 Document  Reviewed: 08/14/2022 Elsevier Patient Education  2024 [employer].

## 2023-08-23 ENCOUNTER — Other Ambulatory Visit: Payer: Self-pay | Admitting: Physician Assistant

## 2023-08-23 DIAGNOSIS — J302 Other seasonal allergic rhinitis: Secondary | ICD-10-CM

## 2023-08-23 NOTE — Telephone Encounter (Signed)
 Copied from CRM [id number]. Topic: Clinical - Medication Refill >> Aug 23, 2023 10:44 AM Bo Mcclintock wrote: Most Recent Primary Care Visit:  Provider: Jomarie Longs  Department: [facility]  Visit Type: OFFICE VISIT  Date: 07/18/2023  Medication: cetirizine (ZYRTEC) 10 MG tablet  Has the patient contacted their pharmacy? Yes (Agent: If no, request that the patient contact the pharmacy for the refill. If patient does not wish to contact the pharmacy document the reason why and proceed with request.) (Agent: If yes, when and what did the pharmacy advise?) Contact PCP  Is this the correct pharmacy for this prescription? No If no, delete pharmacy and type the correct one.  This is the patient's preferred pharmacy:    [facility] [phone] - [place], [place] - [address] [place] [address] Phone: [phone] Fax: [phone]   Has the prescription been filled recently? No  Is the patient out of the medication? Yes  Has the patient been seen for an appointment in the last year OR does the patient have an upcoming appointment? Yes  Can we respond through MyChart? No  Agent: Please be advised that Rx refills may take up to 3 business days. We ask that you follow-up with your pharmacy.

## 2023-08-28 MED ORDER — CETIRIZINE HCL 10 MG PO TABS: ORAL_TABLET | ORAL | 1 refills | Status: AC

## 2023-10-08 ENCOUNTER — Ambulatory Visit (INDEPENDENT_AMBULATORY_CARE_PROVIDER_SITE_OTHER): Payer: Medicare HMO | Admitting: Physician Assistant

## 2023-10-08 ENCOUNTER — Encounter: Payer: Self-pay | Admitting: Physician Assistant

## 2023-10-08 ENCOUNTER — Telehealth: Payer: Self-pay

## 2023-10-08 VITALS — BP 140/80 | HR 68 | Ht 72.0 in | Wt 166.0 lb

## 2023-10-08 DIAGNOSIS — I700 Atherosclerosis of aorta: Secondary | ICD-10-CM

## 2023-10-08 DIAGNOSIS — C4339 Malignant melanoma of other parts of face: Secondary | ICD-10-CM | POA: Diagnosis not present

## 2023-10-08 DIAGNOSIS — F17200 Nicotine dependence, unspecified, uncomplicated: Secondary | ICD-10-CM

## 2023-10-08 DIAGNOSIS — I10 Essential (primary) hypertension: Secondary | ICD-10-CM | POA: Diagnosis not present

## 2023-10-08 DIAGNOSIS — J411 Mucopurulent chronic bronchitis: Secondary | ICD-10-CM | POA: Diagnosis not present

## 2023-10-08 DIAGNOSIS — L578 Other skin changes due to chronic exposure to nonionizing radiation: Secondary | ICD-10-CM

## 2023-10-08 MED ORDER — ATORVASTATIN CALCIUM 40 MG PO TABS: 40.0000 mg | ORAL_TABLET | Freq: Every day | ORAL | 3 refills | Status: DC

## 2023-10-08 MED ORDER — TRELEGY ELLIPTA 100-62.5-25 MCG/ACT IN AEPB: 1.0000 | INHALATION_SPRAY | Freq: Every day | RESPIRATORY_TRACT | 3 refills | Status: AC

## 2023-10-08 NOTE — Telephone Encounter (Signed)
 Referral has been faxed and lab attached.

## 2023-10-08 NOTE — Telephone Encounter (Signed)
 Yes, will do

## 2023-10-08 NOTE — Progress Notes (Signed)
   Established Patient Office Visit  Subjective   Patient ID: Timothy Perkins, male    DOB: July 06, 1950  Age: 73 y.o. MRN: [id number]  Chief Complaint  Patient presents with   Medical Management of Chronic Issues    6 mo fup on Essential hypertension    HPI Pt is a 73 yo male with COPD, HTN, HLD who presents to the clinic for medication refills.   He is doing well with no concerns. He is compliant with medications but been out of trelegy for 3 days. He has cut back on smoking but stills smokes about 1/2 pack a day. He does not want to quit. He denies any CP, palpitations, headaches or vision changes.     Review of Systems  All other systems reviewed and are negative.     Objective:     BP (!) 140/80   Pulse 68   Ht 6' (1.829 m)   Wt 166 lb (75.3 kg)   SpO2 99%   BMI 22.51 kg/m  BP Readings from Last 3 Encounters:  10/08/23 (!) 140/80  07/18/23 (!) 153/75  04/09/23 (!) 153/71   Wt Readings from Last 3 Encounters:  10/08/23 166 lb (75.3 kg)  07/18/23 166 lb (75.3 kg)  04/09/23 164 lb (74.4 kg)      Physical Exam Constitutional:      Appearance: Normal appearance.  HENT:     Head: Normocephalic.  Cardiovascular:     Rate and Rhythm: Normal rate and regular rhythm.  Pulmonary:     Effort: Pulmonary effort is normal.     Breath sounds: Normal breath sounds.  Musculoskeletal:     Right lower leg: No edema.     Left lower leg: No edema.  Skin:    Comments: Hyperpigmented macular nevus of right temple irregular borders approximately 2cm by 2.5cm.   Neurological:     General: No focal deficit present.     Mental Status: He is alert and oriented to person, place, and time.  Psychiatric:        Mood and Affect: Mood normal.      The 10-year ASCVD risk score (Arnett DK, et al., 2019) is: 45.6%    Assessment & Plan:  Timothy Perkins was seen today for medical management of chronic issues.  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  Aortic atherosclerosis (HCC) -     atorvastatin  (LIPITOR) 40 MG tablet; Take 1 tablet (40 mg total) by mouth daily.  Solar aging of skin -     Ambulatory referral to Dermatology  Current smoker   2nd BP improved Cmp ordered  Refills sent of trelegy and lipitor LDL to goal Out of trelegy, samples given today Pt admits to cutting back but not quiting smoking  Noticed nevus of right temple and asked patient about it and stated he never went to dermatology 2 referrals replaced in past 2019 path report shows melanoma in situ  I am very concerned this has progressed Urgent referral made today and STRONG recommendation to get to dermatology   Return in about 6 months (around 04/08/2024).    Emree Locicero, PA-C

## 2023-10-08 NOTE — Telephone Encounter (Signed)
Thank you, Timothy Perkins!

## 2023-10-18 DIAGNOSIS — L821 Other seborrheic keratosis: Secondary | ICD-10-CM | POA: Diagnosis not present

## 2023-10-18 DIAGNOSIS — D485 Neoplasm of uncertain behavior of skin: Secondary | ICD-10-CM | POA: Diagnosis not present

## 2023-10-23 DIAGNOSIS — D0339 Melanoma in situ of other parts of face: Secondary | ICD-10-CM | POA: Diagnosis not present

## 2023-11-11 ENCOUNTER — Other Ambulatory Visit: Payer: Self-pay | Admitting: Physician Assistant

## 2023-11-11 DIAGNOSIS — I700 Atherosclerosis of aorta: Secondary | ICD-10-CM

## 2023-11-28 DIAGNOSIS — S0190XA Unspecified open wound of unspecified part of head, initial encounter: Secondary | ICD-10-CM | POA: Diagnosis not present

## 2023-11-28 DIAGNOSIS — D0339 Melanoma in situ of other parts of face: Secondary | ICD-10-CM | POA: Diagnosis not present

## 2023-12-06 DIAGNOSIS — H5231 Anisometropia: Secondary | ICD-10-CM | POA: Diagnosis not present

## 2023-12-06 DIAGNOSIS — H353130 Nonexudative age-related macular degeneration, bilateral, stage unspecified: Secondary | ICD-10-CM | POA: Diagnosis not present

## 2023-12-06 DIAGNOSIS — H524 Presbyopia: Secondary | ICD-10-CM | POA: Diagnosis not present

## 2023-12-06 DIAGNOSIS — Z135 Encounter for screening for eye and ear disorders: Secondary | ICD-10-CM | POA: Diagnosis not present

## 2023-12-06 DIAGNOSIS — H25813 Combined forms of age-related cataract, bilateral: Secondary | ICD-10-CM | POA: Diagnosis not present

## 2023-12-06 DIAGNOSIS — H401131 Primary open-angle glaucoma, bilateral, mild stage: Secondary | ICD-10-CM | POA: Diagnosis not present

## 2024-02-05 ENCOUNTER — Encounter: Payer: Self-pay | Admitting: Physician Assistant

## 2024-02-05 ENCOUNTER — Ambulatory Visit (INDEPENDENT_AMBULATORY_CARE_PROVIDER_SITE_OTHER): Admitting: Physician Assistant

## 2024-02-05 VITALS — BP 121/98 | HR 77 | Temp 97.7°F | Ht 72.0 in | Wt 163.0 lb

## 2024-02-05 DIAGNOSIS — J441 Chronic obstructive pulmonary disease with (acute) exacerbation: Secondary | ICD-10-CM

## 2024-02-05 DIAGNOSIS — R6889 Other general symptoms and signs: Secondary | ICD-10-CM

## 2024-02-05 DIAGNOSIS — U071 COVID-19: Secondary | ICD-10-CM

## 2024-02-05 LAB — POCT RAPID STREP A (OFFICE): Rapid Strep A Screen: NEGATIVE

## 2024-02-05 LAB — POC SOFIA 2 FLU + SARS ANTIGEN FIA
Influenza A, POC: NEGATIVE
Influenza B, POC: NEGATIVE
SARS Coronavirus 2 Ag: POSITIVE — AB

## 2024-02-05 MED ORDER — AZITHROMYCIN 250 MG PO TABS: ORAL_TABLET | ORAL | 0 refills | Status: DC

## 2024-02-05 MED ORDER — PREDNISONE 50 MG PO TABS: ORAL_TABLET | ORAL | 0 refills | Status: DC

## 2024-02-05 NOTE — Progress Notes (Signed)
 Acute Office Visit  Subjective:     Patient ID: Timothy Perkins, male    DOB: 04-27-1951, 73 y.o.   MRN: [id number]  Chief Complaint  Patient presents with   Cough    HPI Patient is in today for cough, congestion, head pressure, fatigue, body aches for 1 week. Pt has known COPD and on trelegy daily. He has not had his covid booster or flu shot this year. He is taking cold and flu OTC medications. He has had very little benefit. His cough seems to be getting worse. No fever noted.   ROS See HPI>      Objective:    BP (!) 121/98   Pulse 77   Temp 97.7 F (36.5 C) (Oral)   Ht 6' (1.829 m)   Wt 163 lb (73.9 kg)   SpO2 97%   BMI 22.11 kg/m  BP Readings from Last 3 Encounters:  02/05/24 (!) 121/98  10/08/23 (!) 140/80  07/18/23 (!) 153/75   Wt Readings from Last 3 Encounters:  02/05/24 163 lb (73.9 kg)  10/08/23 166 lb (75.3 kg)  07/18/23 166 lb (75.3 kg)      Physical Exam Constitutional:      Appearance: Normal appearance.  HENT:     Head: Normocephalic.     Right Ear: Tympanic membrane, ear canal and external ear normal. There is no impacted cerumen.     Left Ear: Tympanic membrane, ear canal and external ear normal. There is no impacted cerumen.     Nose: Congestion present.     Mouth/Throat:     Mouth: Mucous membranes are moist.     Pharynx: Posterior oropharyngeal erythema present. No oropharyngeal exudate.  Eyes:     Conjunctiva/sclera: Conjunctivae normal.  Cardiovascular:     Rate and Rhythm: Normal rate and regular rhythm.     Pulses: Normal pulses.     Heart sounds: Normal heart sounds.  Pulmonary:     Effort: Pulmonary effort is normal.     Breath sounds: Rhonchi present. No wheezing.  Musculoskeletal:     Cervical back: No tenderness.     Right lower leg: No edema.     Left lower leg: No edema.  Lymphadenopathy:     Cervical: Cervical adenopathy present.  Neurological:     General: No focal deficit present.     Mental Status: He is  oriented to person, place, and time.  Psychiatric:        Mood and Affect: Mood normal.    . Results for orders placed or performed in visit on 02/05/24  POC SOFIA 2 FLU + SARS ANTIGEN FIA   Collection Time: 02/05/24 10:03 AM  Result Value Ref Range   Influenza A, POC Negative Negative   Influenza B, POC Negative Negative   SARS Coronavirus 2 Ag Positive (A) Negative  POCT rapid strep A   Collection Time: 02/05/24 10:04 AM  Result Value Ref Range   Rapid Strep A Screen Negative Negative         Assessment & Plan:  Timothy Perkins was seen today for cough.  Diagnoses and all orders for this visit:  COVID-19 virus infection -     predniSONE  (DELTASONE ) 50 MG tablet; Take one tablet daily for 5 days.  Flu-like symptoms -     POCT rapid strep A -     POC SOFIA 2 FLU + SARS ANTIGEN FIA  COPD exacerbation (HCC) -     azithromycin  (ZITHROMAX  Z-PAK) 250 MG tablet; Take  2 tablets (500 mg) on  Day 1,  followed by 1 tablet (250 mg) once daily on Days 2 through 5. -     predniSONE  (DELTASONE ) 50 MG tablet; Take one tablet daily for 5 days.  Covid positive Day 5 of symptoms, likely not contagious anymore and dealing with post viral symptoms Start zpak and prednisone  Rest and hydrate Use albuterol  if needed and continue on daily Trelegy Follow up as needed if symptoms persist or worsen    Vermell Bologna, PA-C

## 2024-02-05 NOTE — Patient Instructions (Signed)
 COVID-19: What to Know COVID-19 is an infection caused by a virus called SARS-CoV-2. This type of virus is called a coronavirus. People with COVID-19 may: Have few to no symptoms. Have mild to moderate symptoms that affect their lungs and breathing. Get very sick. What are the causes?  COVID-19 is caused by a virus. This virus may be in the air as droplets or on surfaces. It can spread from an infected person when they cough, sneeze, speak, sing, or breathe. You may become infected if: You breathe in the infected droplets in the air. You touch an object that has the virus on it. What increases the risk? You are at risk of getting COVID-19 if you have been around someone with the infection. You may be more likely to get very sick if: You are 57 years old or older. You have certain medical conditions, such as: Heart disease. Diabetes. Long-term respiratory disease. Cancer. Pregnancy. You are immunocompromised. This means your body can't fight infections easily. You have a disability that makes it hard for you to move around, you have trouble moving, or you can't move at all. What are the signs or symptoms? People may have different symptoms from COVID-19. The symptoms can also be mild to very bad. They often show up in 5-6 days after being infected. But, they can take up to 14 days to appear. Common symptoms are: Cough. Feeling tired. New loss of taste or smell. Fever. Less common symptoms are: Sore throat. Headache. Body or muscle aches. Diarrhea. A skin rash or fingers or toes that are a different color than usual. Red or irritated eyes. Sometimes, COVID-19 does not cause symptoms. How is this diagnosed? COVID-19 can be diagnosed with tests done in the lab or at home. Fluid from your nose, mouth, or lungs will be used to check for the virus. How is this treated? Treatment for COVID-19 depends on how sick you are. Mild symptoms can be treated at home with rest, fluids, and  over-the-counter medicines. very bad symptoms may be treated in a hospital intensive care unit (ICU). If you have symptoms and are at risk of getting very sick, you may be given a medicine that fights viruses. This medicine is called an antiviral. How is this prevented? To protect yourself from COVID-19: Know your risk factors. Get vaccinated. If your body can't fight infections easily, talk to your provider about treatment to help prevent COVID-19. Stay at least about 3 feet (1 meter) away from other people. Wear mask that fits well when: You can't stay at a distance from people. You're in a place with not a lot of air flow. Try to be in open spaces with good air flow when you are in public. Wash your hands often or use an alcohol-based hand sanitizer. Cover your nose and mouth when you cough or sneeze. If you think you have COVID-19 or have been around someone who has it, stay home and away from other people as told by your provider or health officials. Where to find more information To learn more: Go to [employer] Click Health Topics. Type COVID-19 in the search box. Go to [employer].br Click Health Topics. Then click All Topics. Type COVID-19 in the search box. Get help right away if: You have trouble breathing or get short of breath. You have pain or pressure in your chest. You're feeling confused. These symptoms may be an emergency. Get help right away. Call 911. Do not wait to see if the symptoms will go away.  Do not drive yourself to the hospital. This information is not intended to replace advice given to you by your health care provider. Make sure you discuss any questions you have with your health care provider. Document Revised: 03/01/2023 Document Reviewed: 02/21/2023 Elsevier Patient Education  2025 [employer].

## 2024-03-20 NOTE — Progress Notes (Signed)
 Timothy Perkins                                          MRN: [id number]   03/20/2024   The [facility] Quality Team Specialist reviewed this patient medical record for the purposes of chart review for care gap closure. The following were reviewed: chart review for care gap closure-controlling blood pressure.    [facility] Quality Team

## 2024-04-04 DIAGNOSIS — K5900 Constipation, unspecified: Secondary | ICD-10-CM | POA: Diagnosis not present

## 2024-04-04 DIAGNOSIS — Z7982 Long term (current) use of aspirin: Secondary | ICD-10-CM | POA: Diagnosis not present

## 2024-04-04 DIAGNOSIS — K760 Fatty (change of) liver, not elsewhere classified: Secondary | ICD-10-CM | POA: Diagnosis not present

## 2024-04-04 DIAGNOSIS — I700 Atherosclerosis of aorta: Secondary | ICD-10-CM | POA: Diagnosis not present

## 2024-04-04 DIAGNOSIS — K219 Gastro-esophageal reflux disease without esophagitis: Secondary | ICD-10-CM | POA: Diagnosis not present

## 2024-04-04 DIAGNOSIS — Z7951 Long term (current) use of inhaled steroids: Secondary | ICD-10-CM | POA: Diagnosis not present

## 2024-04-04 DIAGNOSIS — Z86007 Personal history of in-situ neoplasm of skin: Secondary | ICD-10-CM | POA: Diagnosis not present

## 2024-04-04 DIAGNOSIS — H3530 Unspecified macular degeneration: Secondary | ICD-10-CM | POA: Diagnosis not present

## 2024-04-04 DIAGNOSIS — I10 Essential (primary) hypertension: Secondary | ICD-10-CM | POA: Diagnosis not present

## 2024-04-04 DIAGNOSIS — F419 Anxiety disorder, unspecified: Secondary | ICD-10-CM | POA: Diagnosis not present

## 2024-04-04 DIAGNOSIS — E119 Type 2 diabetes mellitus without complications: Secondary | ICD-10-CM | POA: Diagnosis not present

## 2024-04-04 DIAGNOSIS — N529 Male erectile dysfunction, unspecified: Secondary | ICD-10-CM | POA: Diagnosis not present

## 2024-04-04 DIAGNOSIS — F17210 Nicotine dependence, cigarettes, uncomplicated: Secondary | ICD-10-CM | POA: Diagnosis not present

## 2024-04-04 DIAGNOSIS — J4489 Other specified chronic obstructive pulmonary disease: Secondary | ICD-10-CM | POA: Diagnosis not present

## 2024-04-04 DIAGNOSIS — J302 Other seasonal allergic rhinitis: Secondary | ICD-10-CM | POA: Diagnosis not present

## 2024-04-04 DIAGNOSIS — E785 Hyperlipidemia, unspecified: Secondary | ICD-10-CM | POA: Diagnosis not present

## 2024-04-09 ENCOUNTER — Ambulatory Visit (INDEPENDENT_AMBULATORY_CARE_PROVIDER_SITE_OTHER): Admitting: Physician Assistant

## 2024-04-09 VITALS — BP 124/76 | HR 74 | Ht 72.0 in | Wt 167.0 lb

## 2024-04-09 DIAGNOSIS — E785 Hyperlipidemia, unspecified: Secondary | ICD-10-CM | POA: Diagnosis not present

## 2024-04-09 DIAGNOSIS — I700 Atherosclerosis of aorta: Secondary | ICD-10-CM

## 2024-04-09 DIAGNOSIS — Z23 Encounter for immunization: Secondary | ICD-10-CM | POA: Diagnosis not present

## 2024-04-09 DIAGNOSIS — Z125 Encounter for screening for malignant neoplasm of prostate: Secondary | ICD-10-CM

## 2024-04-09 DIAGNOSIS — K2100 Gastro-esophageal reflux disease with esophagitis, without bleeding: Secondary | ICD-10-CM

## 2024-04-09 DIAGNOSIS — F17200 Nicotine dependence, unspecified, uncomplicated: Secondary | ICD-10-CM | POA: Diagnosis not present

## 2024-04-09 DIAGNOSIS — I2510 Atherosclerotic heart disease of native coronary artery without angina pectoris: Secondary | ICD-10-CM | POA: Diagnosis not present

## 2024-04-09 DIAGNOSIS — J439 Emphysema, unspecified: Secondary | ICD-10-CM

## 2024-04-09 DIAGNOSIS — R351 Nocturia: Secondary | ICD-10-CM

## 2024-04-09 DIAGNOSIS — I10 Essential (primary) hypertension: Secondary | ICD-10-CM

## 2024-04-09 DIAGNOSIS — J432 Centrilobular emphysema: Secondary | ICD-10-CM | POA: Diagnosis not present

## 2024-04-09 DIAGNOSIS — R7301 Impaired fasting glucose: Secondary | ICD-10-CM | POA: Diagnosis not present

## 2024-04-09 MED ORDER — FAMOTIDINE 20 MG PO TABS: 20.0000 mg | ORAL_TABLET | Freq: Two times a day (BID) | ORAL | 3 refills | Status: AC

## 2024-04-09 NOTE — Progress Notes (Signed)
 Established Patient Office Visit  Subjective   Patient ID: Timothy Perkins, male    DOB: Sep 19, 1950  Age: 73 y.o. MRN: [id number]  Chief Complaint  Patient presents with   Medical Management of Chronic Issues    HPI Discussed the use of AI scribe software for clinical note transcription with the patient, who gave verbal consent to proceed.  History of Present Illness Timothy Perkins is a 73 year old male with emphysema and coronary artery disease who presents for routine follow-up and vaccination.  Tobacco use - Continues to smoke approximately one carton per week - Previously quit smoking for a year and a half about fourteen years ago - Considering quitting due to cost  Respiratory symptoms and emphysema management - Diagnosed with emphysema - Experiences hoarseness and fluid in the lungs - Manages symptoms with Mucinex, allergy pills, nasal spray, and saline as needed - Able to cough up fluid effectively - Uses Trelegy every night  Lung cancer screening - History of lung cancer screening - Last CT scan for lung cancer screening on August 15, 2021 - Does not wish to continue with lung cancer screenings  Cardiovascular disease and blood pressure control - History of coronary artery disease - Blood pressure is well-controlled, with a recent reading of 126/69 mmHg - Currently taking atorvastatin  for cholesterol management  Immunization status - Has not received a flu shot or COVID booster this year - Previously received COVID vaccinations at a [facility] in [place]  Genitourinary symptoms - Has not taken Flomax  recently  Anxiety and mental health - Required anxiety medication following his wife's death, now uses only as needed - Feels current relationship helps keep him calm  Functional status and activity level - Active and involved in rescuing animals, including donkeys and horses     ROS See HPI.    Objective:     BP 124/76   Pulse 74   Ht 6' (1.829 m)   Wt  167 lb (75.8 kg)   SpO2 98%   BMI 22.65 kg/m  BP Readings from Last 3 Encounters:  04/09/24 124/76  02/05/24 (!) 121/98  10/08/23 (!) 140/80   Wt Readings from Last 3 Encounters:  04/09/24 167 lb (75.8 kg)  02/05/24 163 lb (73.9 kg)  10/08/23 166 lb (75.3 kg)      Physical Exam Constitutional:      Appearance: Normal appearance.  HENT:     Head: Normocephalic.  Cardiovascular:     Rate and Rhythm: Normal rate and regular rhythm.  Pulmonary:     Effort: Pulmonary effort is normal.     Comments: Coarse breath sounds Musculoskeletal:     Cervical back: Normal range of motion and neck supple.     Right lower leg: No edema.     Left lower leg: No edema.  Neurological:     General: No focal deficit present.     Mental Status: He is alert and oriented to person, place, and time.      The 10-year ASCVD risk score (Arnett DK, et al., 2019) is: 38.8%    Assessment & Plan:  SABRASABRAHattie was seen today for medical management of chronic issues.  Diagnoses and all orders for this visit:  Centrilobular emphysema (HCC) -     CMP14+EGFR -     CBC w/Diff/Platelet  Essential hypertension, benign -     CMP14+EGFR -     CBC w/Diff/Platelet  Elevated fasting glucose -     CBC w/Diff/Platelet  Hyperlipidemia  LDL goal <100 -     Lipid panel -     CBC w/Diff/Platelet  Tobacco dependency -     CBC w/Diff/Platelet  Coronary artery disease involving native coronary artery of native heart without angina pectoris -     CMP14+EGFR -     CBC w/Diff/Platelet  Aortic atherosclerosis -     Lipid panel -     CMP14+EGFR -     CBC w/Diff/Platelet  Prostate cancer screening -     PSA  Nocturia -     PSA  Immunization due -     Flu vaccine HIGH DOSE PF(Fluzone Trivalent) -     Pfizer Comirnaty Covid -19 Vaccine 50yrs and older  Gastroesophageal reflux disease with esophagitis without hemorrhage -     famotidine  (PEPCID ) 20 MG tablet; Take 1 tablet (20 mg total) by mouth 2  (two) times daily.   Assessment & Plan Essential hypertension Blood pressure controlled at 126/69 mmHg.  Atherosclerotic cardiovascular disease Significant atherosclerosis and coronary artery disease identified.  Emphysema Chronic condition  - continue Trelegy - Encouraged Mucinex use and increased water intake. - Continue allergy management with pills and nasal spray. - Declines lung cancer screening  Nicotine dependence Continues smoking despite understanding risks. Considering quitting but declines formal cessation.  Hyperlipidemia Managed with atorvastatin .  GERD - continue pepcid   General Health Maintenance Higher risk for COVID complications. Lacks flu shot and COVID booster this year. - Administer flu and COVID vaccines today.     Kaylamarie Swickard, PA-C

## 2024-04-11 ENCOUNTER — Encounter: Payer: Self-pay | Admitting: Physician Assistant

## 2024-07-17 NOTE — Progress Notes (Signed)
 Timothy Perkins                                          MRN: [id number]   07/17/2024   The [facility] Quality Team Specialist reviewed this patient medical record for the purposes of chart review for care gap closure. The following were reviewed: chart review for care gap closure-kidney health evaluation for diabetes:eGFR  and uACR.    [facility] Quality Team

## 2024-07-17 NOTE — Progress Notes (Signed)
 Timothy Perkins                                          MRN: [id number]   07/17/2024   The [facility] Quality Team Specialist reviewed this patient medical record for the purposes of chart review for care gap closure. The following were reviewed: chart review for care gap closure-glycemic status assessment.    [facility] Quality Team

## 2024-10-08 ENCOUNTER — Ambulatory Visit: Admitting: Physician Assistant
